# Patient Record
Sex: Female | Born: 1985 | Race: White | Hispanic: No | Marital: Single | Smoking: Never smoker
Health system: Southern US, Community
[De-identification: ages and names within clinical notes are randomized; demographics above are authoritative.]

---

## 2008-12-23 ENCOUNTER — Ambulatory Visit: Payer: Self-pay | Admitting: Family Medicine

## 2009-07-16 ENCOUNTER — Inpatient Hospital Stay: Payer: Self-pay

## 2013-09-27 ENCOUNTER — Emergency Department: Payer: Self-pay | Admitting: Emergency Medicine

## 2013-12-11 ENCOUNTER — Emergency Department: Payer: Self-pay | Admitting: Emergency Medicine

## 2013-12-11 LAB — CBC
HCT: 38.9 % (ref 35.0–47.0)
HGB: 12.5 g/dL (ref 12.0–16.0)
MCH: 29 pg (ref 26.0–34.0)
MCHC: 32.2 g/dL (ref 32.0–36.0)
MCV: 90 fL (ref 80–100)
Platelet: 230 10*3/uL (ref 150–440)
RBC: 4.31 10*6/uL (ref 3.80–5.20)
RDW: 13.1 % (ref 11.5–14.5)
WBC: 11.1 10*3/uL — ABNORMAL HIGH (ref 3.6–11.0)

## 2013-12-11 LAB — HCG, QUANTITATIVE, PREGNANCY: Beta Hcg, Quant.: 1 m[IU]/mL

## 2016-02-08 ENCOUNTER — Emergency Department: Payer: Medicaid Other

## 2016-02-08 ENCOUNTER — Ambulatory Visit
Admission: EM | Admit: 2016-02-08 | Discharge: 2016-02-08 | Disposition: A | Discharge status: Home / Self Care | Payer: Medicaid Other | Attending: Family Medicine | Admitting: Family Medicine

## 2016-02-08 ENCOUNTER — Encounter: Payer: Self-pay | Admitting: Emergency Medicine

## 2016-02-08 ENCOUNTER — Emergency Department
Admission: EM | Admit: 2016-02-08 | Discharge: 2016-02-08 | Disposition: A | Discharge status: Home / Self Care | Payer: Medicaid Other | Attending: Emergency Medicine | Admitting: Emergency Medicine

## 2016-02-08 DIAGNOSIS — Z3A01 Less than 8 weeks gestation of pregnancy: Secondary | ICD-10-CM | POA: Diagnosis not present

## 2016-02-08 DIAGNOSIS — O26891 Other specified pregnancy related conditions, first trimester: Secondary | ICD-10-CM

## 2016-02-08 DIAGNOSIS — Z3490 Encounter for supervision of normal pregnancy, unspecified, unspecified trimester: Secondary | ICD-10-CM

## 2016-02-08 DIAGNOSIS — R109 Unspecified abdominal pain: Secondary | ICD-10-CM | POA: Diagnosis present

## 2016-02-08 DIAGNOSIS — O99611 Diseases of the digestive system complicating pregnancy, first trimester: Secondary | ICD-10-CM | POA: Insufficient documentation

## 2016-02-08 DIAGNOSIS — R1030 Lower abdominal pain, unspecified: Secondary | ICD-10-CM

## 2016-02-08 DIAGNOSIS — K5900 Constipation, unspecified: Secondary | ICD-10-CM | POA: Diagnosis not present

## 2016-02-08 DIAGNOSIS — N390 Urinary tract infection, site not specified: Secondary | ICD-10-CM

## 2016-02-08 DIAGNOSIS — O2341 Unspecified infection of urinary tract in pregnancy, first trimester: Secondary | ICD-10-CM | POA: Insufficient documentation

## 2016-02-08 DIAGNOSIS — O26899 Other specified pregnancy related conditions, unspecified trimester: Secondary | ICD-10-CM | POA: Insufficient documentation

## 2016-02-08 LAB — URINALYSIS, COMPLETE (UACMP) WITH MICROSCOPIC
Bilirubin Urine: NEGATIVE
Glucose, UA: NEGATIVE mg/dL
HGB URINE DIPSTICK: NEGATIVE
Ketones, ur: NEGATIVE mg/dL
NITRITE: NEGATIVE
PROTEIN: NEGATIVE mg/dL
RBC / HPF: NONE SEEN RBC/hpf (ref 0–5)
SPECIFIC GRAVITY, URINE: 1.02 (ref 1.005–1.030)
pH: 6.5 (ref 5.0–8.0)

## 2016-02-08 LAB — COMPREHENSIVE METABOLIC PANEL
ALK PHOS: 71 U/L (ref 38–126)
ALT: 34 U/L (ref 14–54)
AST: 28 U/L (ref 15–41)
Albumin: 4.3 g/dL (ref 3.5–5.0)
Anion gap: 7 (ref 5–15)
BILIRUBIN TOTAL: 0.3 mg/dL (ref 0.3–1.2)
BUN: 12 mg/dL (ref 6–20)
CALCIUM: 8.9 mg/dL (ref 8.9–10.3)
CO2: 26 mmol/L (ref 22–32)
CREATININE: 0.63 mg/dL (ref 0.44–1.00)
Chloride: 100 mmol/L — ABNORMAL LOW (ref 101–111)
GFR calc Af Amer: >60 mL/min (ref >60)
GLUCOSE: 84 mg/dL (ref 65–99)
Potassium: 3.6 mmol/L (ref 3.5–5.1)
Sodium: 133 mmol/L — ABNORMAL LOW (ref 135–145)
TOTAL PROTEIN: 8 g/dL (ref 6.5–8.1)

## 2016-02-08 LAB — CBC WITH DIFFERENTIAL/PLATELET
BASOS PCT: 1 %
Basophils Absolute: 0.1 10*3/uL (ref 0–0.1)
Eosinophils Absolute: 0.5 10*3/uL (ref 0–0.7)
Eosinophils Relative: 5 %
HEMATOCRIT: 40.6 % (ref 35.0–47.0)
Hemoglobin: 13.5 g/dL (ref 12.0–16.0)
Lymphocytes Relative: 28 %
Lymphs Abs: 3.1 10*3/uL (ref 1.0–3.6)
MCH: 29.3 pg (ref 26.0–34.0)
MCHC: 33.3 g/dL (ref 32.0–36.0)
MCV: 87.9 fL (ref 80.0–100.0)
MONO ABS: 1 10*3/uL — AB (ref 0.2–0.9)
Monocytes Relative: 9 %
NEUTROS ABS: 6.5 10*3/uL (ref 1.4–6.5)
Neutrophils Relative %: 57 %
PLATELETS: 220 10*3/uL (ref 150–440)
RBC: 4.61 MIL/uL (ref 3.80–5.20)
RDW: 13.5 % (ref 11.5–14.5)
WBC: 11.2 10*3/uL — AB (ref 3.6–11.0)

## 2016-02-08 LAB — LIPASE, BLOOD: LIPASE: 46 U/L (ref 11–51)

## 2016-02-08 LAB — HCG, QUANTITATIVE, PREGNANCY: hCG, Beta Chain, Quant, S: 26413 m[IU]/mL — ABNORMAL HIGH (ref <5)

## 2016-02-08 MED ORDER — ONDANSETRON 4 MG PO TBDP: 4.0000 mg | ORAL_TABLET | Freq: Three times a day (TID) | ORAL | 0 refills | Status: DC | PRN

## 2016-02-08 MED ORDER — NITROFURANTOIN MONOHYD MACRO 100 MG PO CAPS: 100.0000 mg | ORAL_CAPSULE | Freq: Two times a day (BID) | ORAL | 0 refills | Status: DC

## 2016-02-08 NOTE — Discharge Instructions (Signed)
Recommend patient go to ED for further evaluation  °

## 2016-02-08 NOTE — ED Notes (Signed)
Pt ambulatory to triage without difficulty or distress noted; st sent [facility] to r/o ectopic pregnancy; pt initially seen for abd bloating, unaware of pregnancy; st was told "need to be seen ASAP because it could explode any second"; charge nurse notified and u/s ordered

## 2016-02-08 NOTE — ED Triage Notes (Signed)
Pt ambulatory to triaged with steady gait sent [facility], pt reports was told she might have an ectopic pregnancy. Pt c/o abd pain for a week.

## 2016-02-08 NOTE — ED Provider Notes (Signed)
[facility] Emergency Department Provider Note        Time seen: ----------------------------------------- 10:11 PM on 02/08/2016 -----------------------------------------    I have reviewed the triage vital signs and the nursing notes.   HISTORY  Chief Complaint Abdominal Pain and Threatened Miscarriage    HPI Martha Scott is a 30 y.o. female presents to the ER for possible ectopic pregnancy. Patient's had bilateral abdominal pain for the past week and is concerned she may be pregnant as well. Patient was sent from urgent care to be evaluated for ectopic pregnancy. She's had abdominal pain for the last 7 days. His fevers, chills, states she has been somewhat constipated, has also had some nausea.   History reviewed. No pertinent past medical history.  There are no active problems to display for this patient.   History reviewed. No pertinent surgical history.  Allergies Patient has no known allergies.  Social History Social History  Substance Use Topics  . Smoking status: Never Smoker  . Smokeless tobacco: Never Used  . Alcohol use Yes    Review of Systems Constitutional: Negative for fever. Cardiovascular: Negative for chest pain. Respiratory: Negative for shortness of breath. Gastrointestinal: Positive for abdominal pain, constipation, nausea Genitourinary: Negative for dysuria. Musculoskeletal: Negative for back pain. Skin: Negative for rash. Neurological: Negative for headaches, focal weakness or numbness.  10-point ROS otherwise negative.  ____________________________________________   PHYSICAL EXAM:  VITAL SIGNS: ED Triage Vitals  Enc Vitals Group     BP 02/08/16 2032 111/70     Pulse Rate 02/08/16 2032 72     Resp 02/08/16 2032 18     Temp 02/08/16 2032 98.7 F (37.1 C)     Temp Source 02/08/16 2032 Oral     SpO2 02/08/16 2032 100 %     Weight 02/08/16 2036 162 lb (73.5 kg)     Height 02/08/16 2036 5\' 5"  (1.651  m)     Head Circumference --      Peak Flow --      Pain Score 02/08/16 2036 6     Pain Loc --      Pain Edu? --      Excl. in GC? --     Constitutional: Alert and oriented. Well appearing and in no distress. Eyes: Conjunctivae are normal. PERRL. Normal extraocular movements. ENT   Head: Normocephalic and atraumatic.   Nose: No congestion/rhinnorhea.   Mouth/Throat: Mucous membranes are moist.   Neck: No stridor. Cardiovascular: Normal rate, regular rhythm. No murmurs, rubs, or gallops. Respiratory: Normal respiratory effort without tachypnea nor retractions. Breath sounds are clear and equal bilaterally. No wheezes/rales/rhonchi. Gastrointestinal: Nonfocal tenderness, no rebound or guarding. Normal bowel sounds. Musculoskeletal: Nontender with normal range of motion in all extremities. No lower extremity tenderness nor edema. Neurologic:  Normal speech and language. No gross focal neurologic deficits are appreciated.  Skin:  Skin is warm, dry and intact. No rash noted. Psychiatric: Mood and affect are normal. Speech and behavior are normal.  ____________________________________________  ED COURSE:  Pertinent labs & imaging results that were available during my care of the patient were reviewed by me and considered in my medical decision making (see chart for details). Clinical Course   Patient is in no acute distress, we will assess with labs and ultrasound.  Procedures ____________________________________________   LABS (pertinent positives/negatives)  Labs Reviewed - No data to display  RADIOLOGY Images were viewed by me  IMPRESSION: Single intrauterine gestation with measurements and dates as above.  ____________________________________________  FINAL ASSESSMENT AND PLAN  Abdominal pain and pregnancy, UTI  Plan: Patient with labs and imaging as dictated above. Patient with outpatient labs today which reveal UTI. Ultrasound is reassuring. She'll  be discharged with Zofran, Macrobid and outpatient follow-up.   Emily FilbertWilliams, Zarah Carbon E, MD   Note: This dictation was prepared with Dragon dictation. Any transcriptional errors that result from this process are unintentional    Emily FilbertJonathan E Annaclaire Walsworth, MD 02/08/16 2213

## 2016-02-08 NOTE — ED Triage Notes (Signed)
Patient c/o having bloated abdomen. She feels like she isnt digesting her food. Her last bm was yesterday. She feels nausea, she hasnt had her menstrual cycle for 2 months. She had a pap smear about 2 months ago,  her test results were benign for cervical cancer . She doesn't know if she is pregnant or not. She was treated for BV recently. She is still having some discharge.

## 2016-02-10 LAB — URINE CULTURE: SPECIAL REQUESTS: NORMAL

## 2016-04-27 NOTE — ED Provider Notes (Signed)
[facility]    CSN: [id number] Arrival date & time: 02/08/16  1459     History   Chief Complaint Chief Complaint  Patient presents with  . Abdominal Pain    HPI Georgiann Cockerlizabeth Reger is a 31 y.o. female.   The history is provided by the patient.  Abdominal Pain  Pain location:  LLQ and RLQ Pain quality: bloating   Pain radiates to:  Does not radiate Pain severity:  Moderate Onset quality:  Sudden Timing:  Constant Progression:  Worsening Chronicity:  New Context: retching   Context: not alcohol use, not awakening from sleep, not diet changes, not eating, not laxative use, not medication withdrawal, not previous surgeries, not recent illness, not recent sexual activity, not recent travel, not sick contacts, not suspicious food intake and not trauma   Relieved by:  None tried Ineffective treatments:  None tried Associated symptoms: nausea   Associated symptoms: no chest pain, no chills, no constipation, no diarrhea, no dysuria, no fever, no hematemesis, no hematochezia, no hematuria and no vaginal bleeding   Risk factors: no alcohol abuse, no aspirin use, not elderly, has not had multiple surgeries, no NSAID use, not obese and no recent hospitalization  Pregnant now: unknown per patient; however has not had menstrual period in 2 months.     No past medical history on file.  There are no active problems to display for this patient.   No past surgical history on file.  OB History    No data available       Home Medications    Prior to Admission medications   Medication Sig Start Date End Date Taking? Authorizing Provider  ALPRAZolam Prudy Feeler(XANAX) 0.5 MG tablet Take 0.5 mg by mouth at bedtime as needed for anxiety.   Yes Historical Provider, MD  nitrofurantoin, macrocrystal-monohydrate, (MACROBID) 100 MG capsule Take 1 capsule (100 mg total) by mouth 2 (two) times daily. 02/08/16   Emily FilbertJonathan E Williams, MD  ondansetron (ZOFRAN ODT) 4 MG disintegrating tablet Take  1 tablet (4 mg total) by mouth every 8 (eight) hours as needed for nausea or vomiting. 02/08/16   Emily FilbertJonathan E Williams, MD    Family History No family history on file.  Social History Social History  Substance Use Topics  . Smoking status: Never Smoker  . Smokeless tobacco: Never Used  . Alcohol use Yes     Allergies   Patient has no known allergies.   Review of Systems Review of Systems  Constitutional: Negative for chills and fever.  Cardiovascular: Negative for chest pain.  Gastrointestinal: Positive for abdominal pain and nausea. Negative for constipation, diarrhea, hematemesis and hematochezia.  Genitourinary: Negative for dysuria, hematuria and vaginal bleeding.     Physical Exam Triage Vital Signs ED Triage Vitals  Enc Vitals Group     BP 02/08/16 1731 117/74     Pulse Rate 02/08/16 1731 76     Resp 02/08/16 1731 18     Temp 02/08/16 1731 98.2 F (36.8 C)     Temp Source 02/08/16 1731 Oral     SpO2 02/08/16 1731 100 %     Weight 02/08/16 1729 162 lb (73.5 kg)     Height 02/08/16 1729 5\' 5"  (1.651 m)     Head Circumference --      Peak Flow --      Pain Score 02/08/16 1731 4     Pain Loc --      Pain Edu? --      Excl.  in GC? --    No data found.   Updated Vital Signs BP 117/74 (BP Location: Left Arm)   Pulse 76   Temp 98.2 F (36.8 C) (Oral)   Resp 18   Ht 5\' 5"  (1.651 m)   Wt 162 lb (73.5 kg)   LMP 11/29/2015   SpO2 100%   BMI 26.96 kg/m   Visual Acuity Right Eye Distance:   Left Eye Distance:   Bilateral Distance:    Right Eye Near:   Left Eye Near:    Bilateral Near:     Physical Exam  Constitutional: She appears well-developed and well-nourished. No distress.  Abdominal: Soft. Bowel sounds are normal. She exhibits no distension and no mass. There is tenderness (diffuse, lower abdominal; no rebound or guarding). There is no rebound and no guarding.  Skin: She is not diaphoretic.  Nursing note and vitals reviewed.    UC  Treatments / Results  Labs (all labs ordered are listed, but only abnormal results are displayed) Labs Reviewed  URINE CULTURE - Abnormal; Notable for the following:       Result Value   Culture MULTIPLE SPECIES PRESENT, SUGGEST RECOLLECTION (*)    All other components within normal limits  COMPREHENSIVE METABOLIC PANEL - Abnormal; Notable for the following:    Sodium 133 (*)    Chloride 100 (*)    All other components within normal limits  HCG, QUANTITATIVE, PREGNANCY - Abnormal; Notable for the following:    hCG, Beta Chain, Quant, S 26,413 (*)    All other components within normal limits  CBC WITH DIFFERENTIAL/PLATELET - Abnormal; Notable for the following:    WBC 11.2 (*)    Monocytes Absolute 1.0 (*)    All other components within normal limits  URINALYSIS, COMPLETE (UACMP) WITH MICROSCOPIC - Abnormal; Notable for the following:    APPearance CLOUDY (*)    Leukocytes, UA LARGE (*)    Squamous Epithelial / LPF 6-30 (*)    Bacteria, UA MANY (*)    All other components within normal limits  LIPASE, BLOOD    EKG  EKG Interpretation None       Radiology No results found.  Procedures Procedures (including critical care time)  Medications Ordered in UC Medications - No data to display   Initial Impression / Assessment and Plan / UC Course  I have reviewed the triage vital signs and the nursing notes.  Pertinent labs & imaging results that were available during my care of the patient were reviewed by me and considered in my medical decision making (see chart for details).       Final Clinical Impressions(s) / UC Diagnoses   Final diagnoses:  Lower abdominal pain  Pregnancy, unspecified gestational age    New Prescriptions Discharge Medication List as of 02/08/2016  7:45 PM     1. Lab results and diagnosis reviewed with patient; due to patient's symptoms and unknown pregnancy gestational age, recommend patient go to ED for further evaluation (possible US  imaging) and management; patient verbalizes understanding and will proceed to ED in stable condition by private vehicle   Payton Mccallum, MD 04/27/16 [phone]

## 2017-01-18 ENCOUNTER — Emergency Department
Admission: EM | Admit: 2017-01-18 | Discharge: 2017-01-19 | Disposition: A | Discharge status: Home / Self Care | Payer: Medicaid Other | Attending: Emergency Medicine | Admitting: Emergency Medicine

## 2017-01-18 ENCOUNTER — Encounter: Payer: Self-pay | Admitting: Emergency Medicine

## 2017-01-18 DIAGNOSIS — Z79899 Other long term (current) drug therapy: Secondary | ICD-10-CM | POA: Insufficient documentation

## 2017-01-18 DIAGNOSIS — J020 Streptococcal pharyngitis: Secondary | ICD-10-CM | POA: Insufficient documentation

## 2017-01-18 DIAGNOSIS — R070 Pain in throat: Secondary | ICD-10-CM | POA: Diagnosis present

## 2017-01-18 LAB — POCT RAPID STREP A: STREPTOCOCCUS, GROUP A SCREEN (DIRECT): POSITIVE — AB

## 2017-01-18 MED ORDER — ACETAMINOPHEN 500 MG PO TABS
1000.0000 mg | ORAL_TABLET | Freq: Once | ORAL | Status: AC
  Administered 2017-01-18: 1000 mg via ORAL
  Filled 2017-01-18: qty 2

## 2017-01-18 NOTE — ED Triage Notes (Addendum)
Patient ambulatory to triage with steady gait, without difficulty or distress noted, mask in place; pt reports fever & sore throat since yesterday; pt reports that she took 1000mg  ibuprofen PTA; pt education on proper dosing of ibuprofen; pt reports her son recently dx with strep

## 2017-01-18 NOTE — ED Notes (Signed)
POCT Rapid Strep A POSITIVE

## 2017-01-19 LAB — CBC WITH DIFFERENTIAL/PLATELET
Basophils Absolute: 0.1 10*3/uL (ref 0–0.1)
Basophils Relative: 0 %
EOS ABS: 0 10*3/uL (ref 0–0.7)
Eosinophils Relative: 0 %
HEMATOCRIT: 40.8 % (ref 35.0–47.0)
HEMOGLOBIN: 13.5 g/dL (ref 12.0–16.0)
LYMPHS ABS: 1.2 10*3/uL (ref 1.0–3.6)
LYMPHS PCT: 8 %
MCH: 28.6 pg (ref 26.0–34.0)
MCHC: 33 g/dL (ref 32.0–36.0)
MCV: 86.7 fL (ref 80.0–100.0)
MONOS PCT: 5 %
Monocytes Absolute: 0.9 10*3/uL (ref 0.2–0.9)
NEUTROS PCT: 87 %
Neutro Abs: 13.9 10*3/uL — ABNORMAL HIGH (ref 1.4–6.5)
PLATELETS: 244 10*3/uL (ref 150–440)
RBC: 4.7 MIL/uL (ref 3.80–5.20)
RDW: 14 % (ref 11.5–14.5)
WBC: 16.1 10*3/uL — ABNORMAL HIGH (ref 3.6–11.0)

## 2017-01-19 LAB — BASIC METABOLIC PANEL
Anion gap: 9 (ref 5–15)
BUN: 12 mg/dL (ref 6–20)
CHLORIDE: 103 mmol/L (ref 101–111)
CO2: 24 mmol/L (ref 22–32)
CREATININE: 0.83 mg/dL (ref 0.44–1.00)
Calcium: 8.6 mg/dL — ABNORMAL LOW (ref 8.9–10.3)
GFR calc non Af Amer: >60 mL/min (ref >60)
Glucose, Bld: 108 mg/dL — ABNORMAL HIGH (ref 65–99)
POTASSIUM: 3.2 mmol/L — AB (ref 3.5–5.1)
Sodium: 136 mmol/L (ref 135–145)

## 2017-01-19 MED ORDER — AMOXICILLIN-POT CLAVULANATE 875-125 MG PO TABS
1.0000 | ORAL_TABLET | Freq: Once | ORAL | Status: AC
  Administered 2017-01-19: 1 via ORAL
  Filled 2017-01-19: qty 1

## 2017-01-19 MED ORDER — AMOXICILLIN-POT CLAVULANATE 875-125 MG PO TABS: 1.0000 | ORAL_TABLET | Freq: Two times a day (BID) | ORAL | 0 refills | Status: AC

## 2017-01-19 MED ORDER — SODIUM CHLORIDE 0.9 % IV BOLUS (SEPSIS)
1000.0000 mL | INTRAVENOUS | Status: AC
  Administered 2017-01-19: 1000 mL via INTRAVENOUS

## 2017-01-19 NOTE — Discharge Instructions (Signed)
Please take the full course of antibiotics as prescribed (twice a day until the pills are gone).  Do not stop taking them early!  You may develop diarrhea on this medication, so be sure and stay hydrated by drinking plenty of fluids (water, Gatorade, etc).  Use over-the-counter ibuprofen and/or Tylenol as needed for pain/fever.  Return to the emergency department if you develop new or worsening symptoms that concern you, such as worsening fever, changing (muffled) voice, difficulty swallowing, etc.

## 2017-01-19 NOTE — ED Provider Notes (Signed)
[facility] Emergency Department Provider Note  ____________________________________________   First MD Initiated Contact with Patient 01/19/17 0012     (approximate)  I have reviewed the triage vital signs and the nursing notes.   HISTORY  Chief Complaint Sore Throat and Fever    HPI Martha Scott is a 31 y.o. female who is otherwise healthy and presents for evaluation of a relatively acute onset sore throat over the last couple of days.  She had some mild pain yesterday but I was not much of a problem.  Over the course of the last day it has become severe.  She can still swallow but it hurts to do so.  She has not had any voice change such as a muffled or hoarse voice.  Hot foods or liquids make the throat hurt worse, nothing in particular makes it better.  She has had mild fever.  She denies cough, chest pain, shortness of breath, ear pain, nausea, vomiting, and abdominal pain.  She reports that her child had strep throat but that was more than a month ago.  She has not had any antibiotics recently.  History reviewed. No pertinent past medical history.  There are no active problems to display for this patient.   History reviewed. No pertinent surgical history.  Prior to Admission medications   Medication Sig Start Date End Date Taking? Authorizing Provider  ALPRAZolam Prudy Feeler(XANAX) 0.5 MG tablet Take 0.5 mg by mouth at bedtime as needed for anxiety.    [provider]  amoxicillin-clavulanate (AUGMENTIN) 875-125 MG tablet Take 1 tablet by mouth every 12 (twelve) hours for 14 days. 01/19/17 02/02/17  Loleta RoseForbach, Nekia Maxham, MD  nitrofurantoin, macrocrystal-monohydrate, (MACROBID) 100 MG capsule Take 1 capsule (100 mg total) by mouth 2 (two) times daily. 02/08/16   Emily FilbertWilliams, Jonathan E, MD  ondansetron (ZOFRAN ODT) 4 MG disintegrating tablet Take 1 tablet (4 mg total) by mouth every 8 (eight) hours as needed for nausea or vomiting. 02/08/16   Emily FilbertWilliams, Jonathan  E, MD    Allergies Patient has no known allergies.  No family history on file.  Social History Social History   Tobacco Use  . Smoking status: Never Smoker  . Smokeless tobacco: Never Used  Substance Use Topics  . Alcohol use: Yes  . Drug use: No    Review of Systems Constitutional: Mild fever Eyes: No visual changes. ENT: Sore throat and odynophagia Cardiovascular: Denies chest pain. Respiratory: Denies shortness of breath. Gastrointestinal: No abdominal pain.  No nausea, no vomiting.  No diarrhea.  No constipation. Genitourinary: Negative for dysuria. Musculoskeletal: Negative for neck pain.  Negative for back pain. Integumentary: Negative for rash. Neurological: Negative for headaches, focal weakness or numbness.   ____________________________________________   PHYSICAL EXAM:  VITAL SIGNS: ED Triage Vitals  Enc Vitals Group     BP 01/18/17 2330 122/90     Pulse Rate 01/18/17 2330 (!) 129     Resp 01/18/17 2330 20     Temp 01/18/17 2330 (!) 102 F (38.9 C)     Temp Source 01/18/17 2330 Oral     SpO2 01/18/17 2330 98 %     Weight 01/18/17 2330 77.1 kg (170 lb)     Height 01/18/17 2330 1.651 m (5\' 5" )     Head Circumference --      Peak Flow --      Pain Score 01/18/17 2328 8     Pain Loc --      Pain Edu? --  Excl. in GC? --     Constitutional: Alert and oriented.  Appears uncomfortable but nontoxic Eyes: Conjunctivae are normal.  Head: Atraumatic. Ears:  Healthy appearing ear canals and TMs bilaterally Nose: No congestion/rhinnorhea. Mouth/Throat: Mucous membranes are moist.  Oropharynx non-erythematous.  The right tonsil appears enlarged, inflamed, and has some exudate.  There is no uvular deviation and no evidence of peritonsillar abscess in spite of the enlarged tonsil.  Left tonsil looks erythematous but otherwise normal.  Soft under tongue, no evidence of Ludwig's angina.  Normal voice. Neck: No stridor.  No meningeal signs.  Patient has no  tenderness with manipulation of the larynx, no mandibular brawny induration. Cardiovascular: Tachycardia, regular rhythm. Good peripheral circulation. Grossly normal heart sounds. Respiratory: Normal respiratory effort.  No retractions. Lungs CTAB. Gastrointestinal: Soft and nontender. No distention.  Musculoskeletal: No lower extremity tenderness nor edema. No gross deformities of extremities. Neurologic:  Normal speech and language. No gross focal neurologic deficits are appreciated.  Skin:  Skin is warm, dry and intact. No rash noted. Psychiatric: Mood and affect are normal. Speech and behavior are normal.  ____________________________________________   LABS (all labs ordered are listed, but only abnormal results are displayed)  Labs Reviewed  BASIC METABOLIC PANEL - Abnormal; Notable for the following components:      Result Value   Potassium 3.2 (*)    Glucose, Bld 108 (*)    Calcium 8.6 (*)    All other components within normal limits  CBC WITH DIFFERENTIAL/PLATELET - Abnormal; Notable for the following components:   WBC 16.1 (*)    Neutro Abs 13.9 (*)    All other components within normal limits  POCT RAPID STREP A - Abnormal; Notable for the following components:   Streptococcus, Group A Screen (Direct) POSITIVE (*)    All other components within normal limits   ____________________________________________  EKG  None - EKG not ordered by ED physician ____________________________________________  RADIOLOGY   No results found.  ____________________________________________   PROCEDURES  Critical Care performed: No   Procedure(s) performed:   Procedures   ____________________________________________   INITIAL IMPRESSION / ASSESSMENT AND PLAN / ED COURSE  As part of my medical decision making, I reviewed the following data within the electronic MEDICAL RECORD NUMBER Nursing notes reviewed and incorporated and Labs reviewed     Differential diagnosis  includes, but is not limited to, viral pharyngitis, strep pharyngitis or other bacterial pharyngitis, tonsillitis, retropharyngeal abscess, peritonsillar abscess, Ludwig's angina, epiglottitis.  The patient's strep screening was positive.  On physical exam she has no signs of peritonsillar abscess at this point even though her right tonsil appears infected.  She has no signs concerning for epiglottitis and is in a reclined position, tolerating her secretions, able to tolerate p.o. intake, no evidence of airway compromise, no muffled voice.  I believe she would benefit from antibiotic treatment and I will treat her aggressively with Augmentin on the rare chance that she is developing a small abscess or phlegmon, but I do not feel she would benefit from CT scan of her neck at this point and I think she should be fine with empiric treatment.  Given her tachycardia and general uncomfortable appearance, I will give her a liter of fluids and check some basic labs, but she is comfortable with the plan for discharge and outpatient follow-up.  I gave strict return precautions.  Clinical Course as of Jan 20 139  Thu Jan 19, 2017  0129 Potassium slightly low, WBC slightly elevated  at 5516, otherwise labs are unremarkable.  Will continue with plan for discharge with Augmentin and strict return precautions.  [CF]    Clinical Course User Index [CF] Loleta RoseForbach, Antoinett Dorman, MD    ____________________________________________  FINAL CLINICAL IMPRESSION(S) / ED DIAGNOSES  Final diagnoses:  Strep pharyngitis     MEDICATIONS GIVEN DURING THIS VISIT:  Medications  acetaminophen (TYLENOL) tablet 1,000 mg (1,000 mg Oral Given 01/18/17 2335)  amoxicillin-clavulanate (AUGMENTIN) 875-125 MG per tablet 1 tablet (1 tablet Oral Given 01/19/17 0025)  sodium chloride 0.9 % bolus 1,000 mL (0 mLs Intravenous Stopped 01/19/17 0122)     ED Discharge Orders        Ordered    amoxicillin-clavulanate (AUGMENTIN) 875-125 MG tablet   Every 12 hours     01/19/17 0130       Note:  This document was prepared using Dragon voice recognition software and may include unintentional dictation errors.    Loleta RoseForbach, Sharika Mosquera, MD 01/19/17 [id number]

## 2017-01-19 NOTE — ED Notes (Signed)
ED Provider at bedside. 

## 2017-01-19 NOTE — ED Notes (Signed)

## 2017-06-26 IMAGING — US US OB COMP LESS 14 WK
1 series · 14 of 28 positions shown · non-contrast
Comparison: None.

CLINICAL DATA: Abdominal pain

EXAM:
OBSTETRIC <14 WK US AND TRANSVAGINAL OB US
TECHNIQUE: Both transabdominal and transvaginal ultrasound examinations were
performed for complete evaluation of the gestation as well as the
maternal uterus, adnexal regions, and pelvic cul-de-sac.
Transvaginal technique was performed to assess early pregnancy.

[Series 1: us ob comp less 14 wk · 0.15mm/px · 97 acquisitions, 14 frames shown]
[im 4/97]
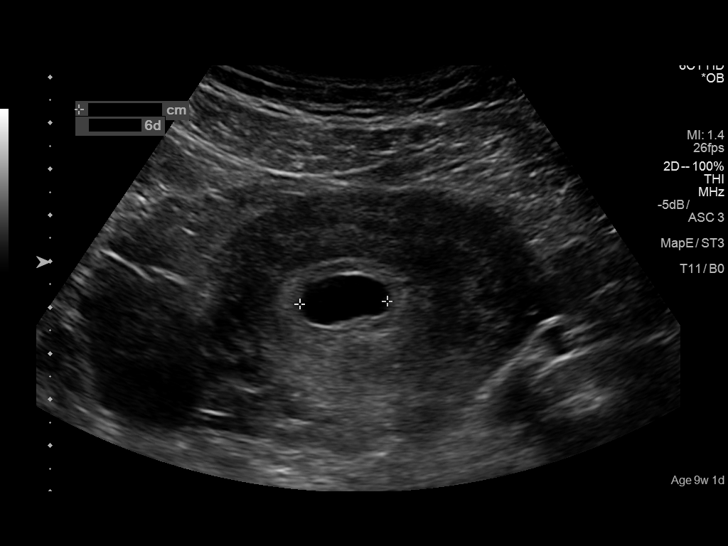
[im 11/97]
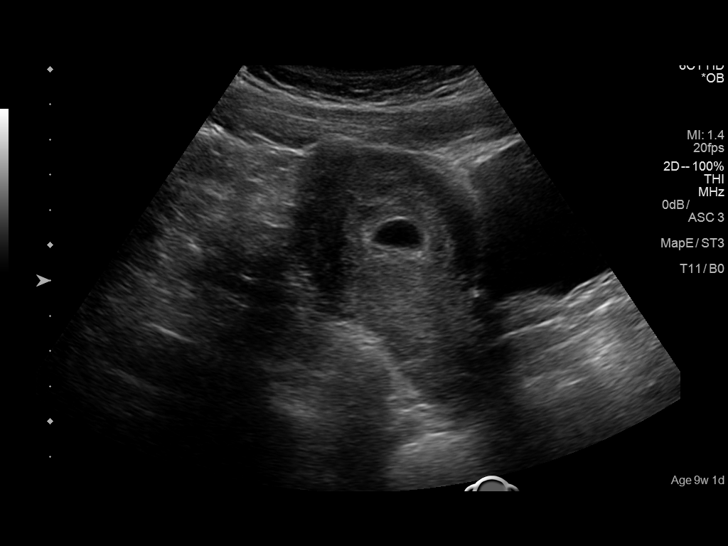
[im 18/97]
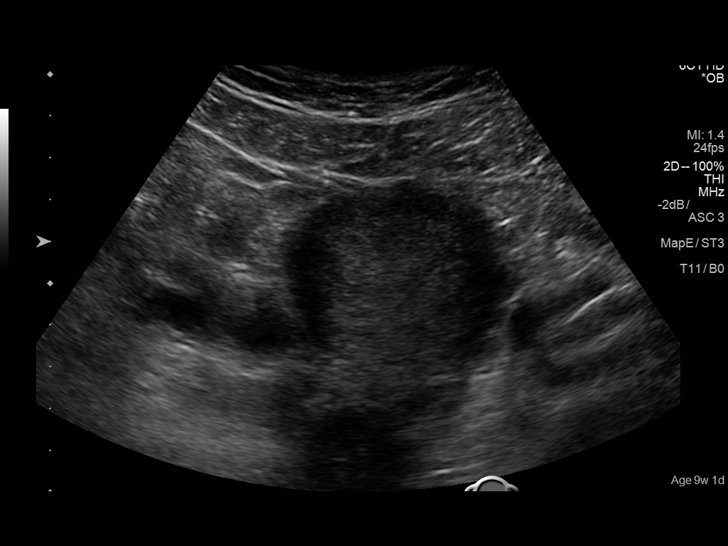
[im 25/97]
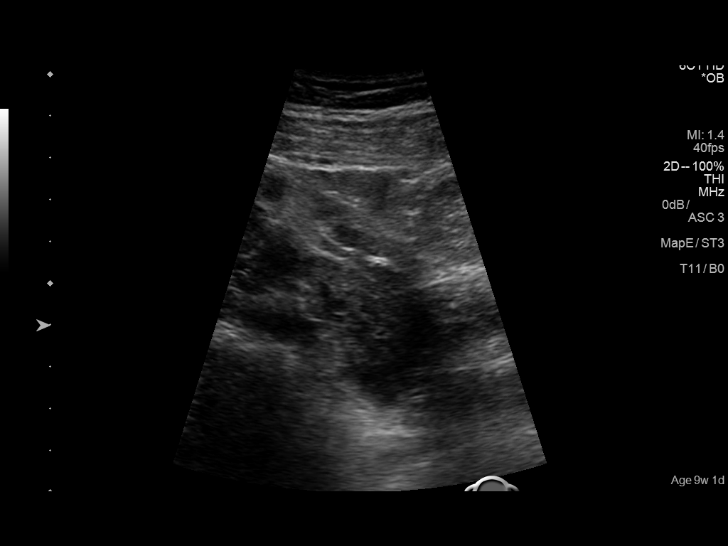
[im 33/97]
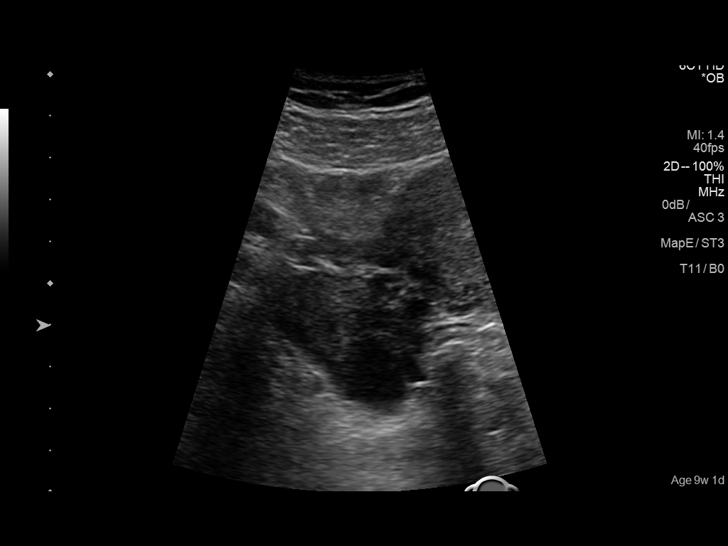
[im 40/97]
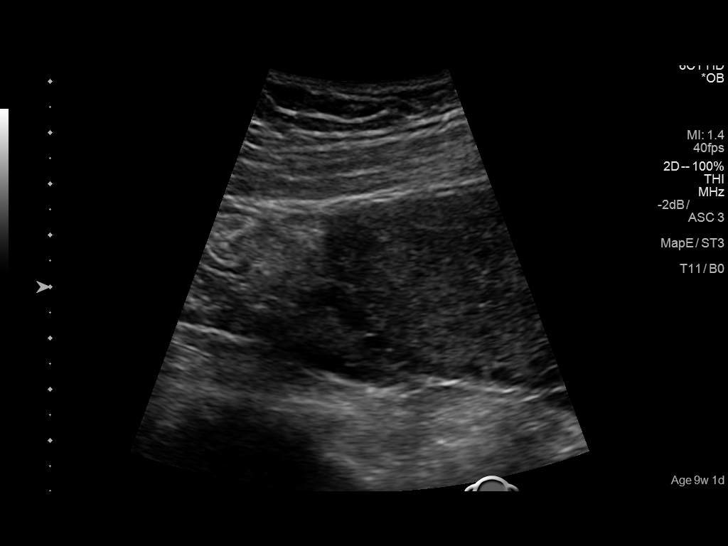
[im 47/97]
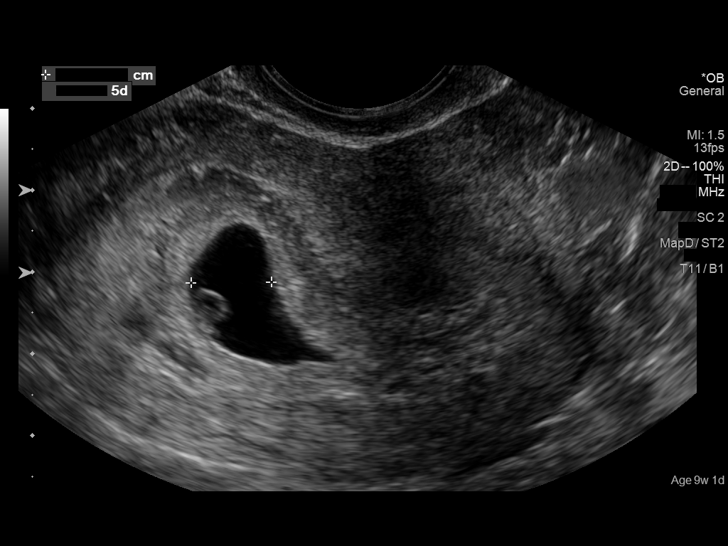
[im 54/97]
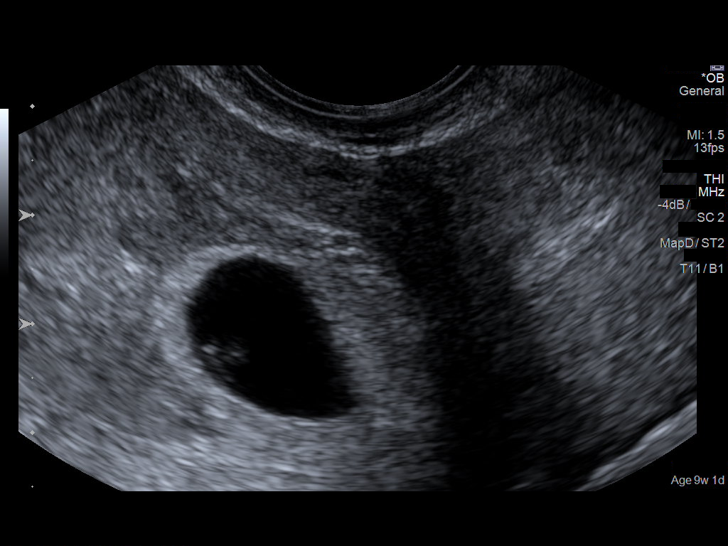
[im 61/97]
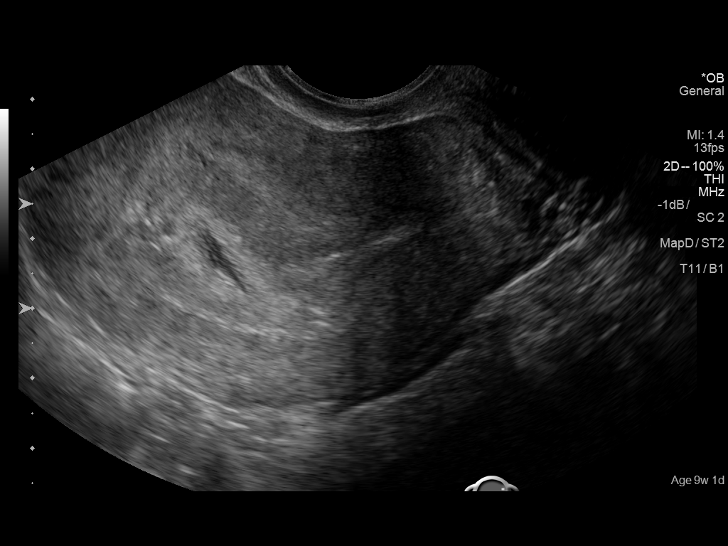
[im 68/97]
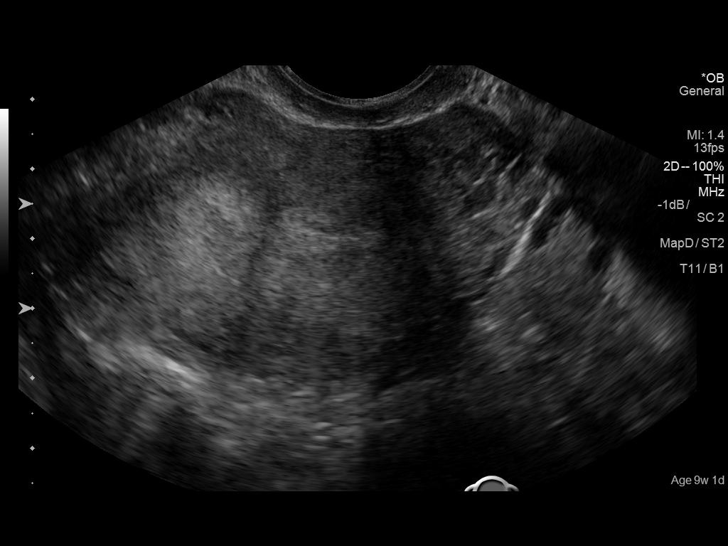
[im 75/97]
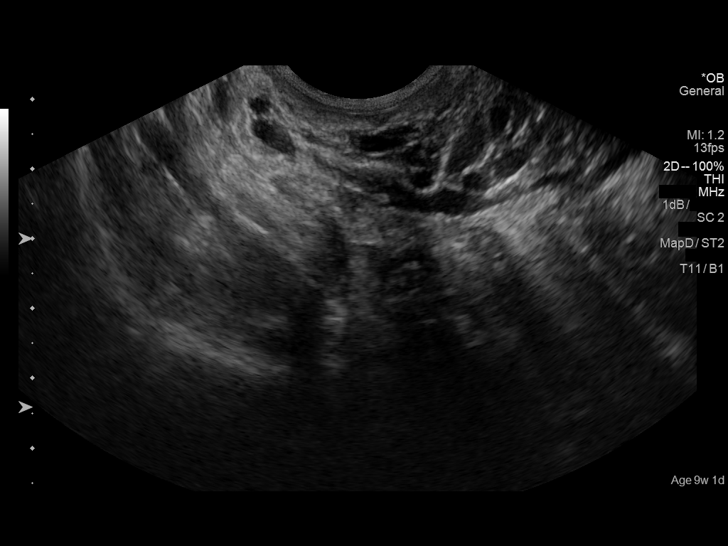
[im 82/97]
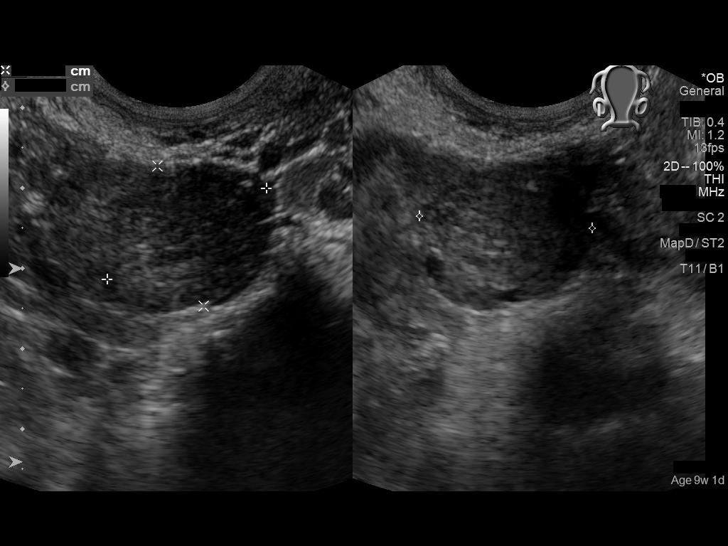
[im 89/97]
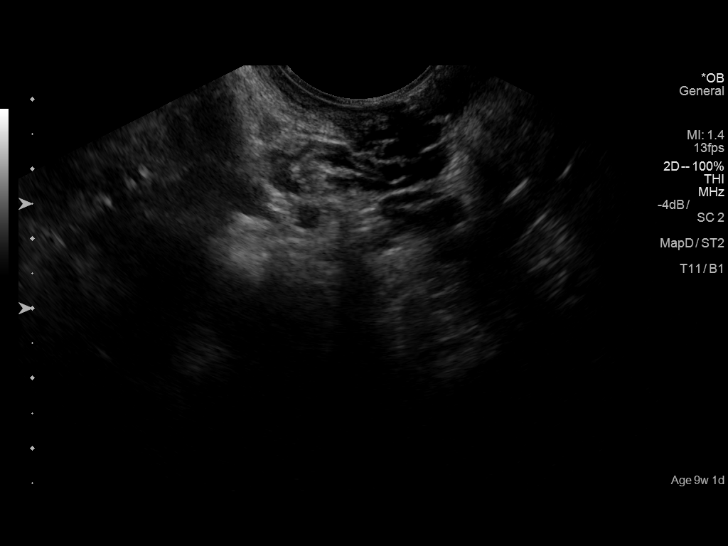
[im 97/97]
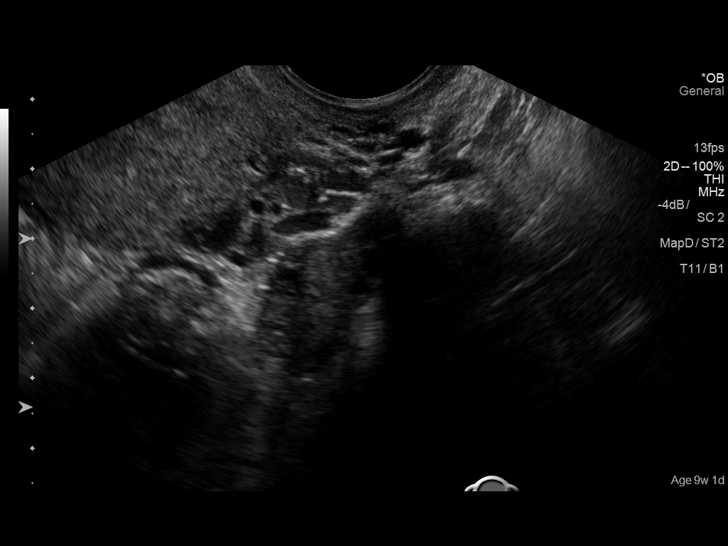

[14 of 28 positions shown; findings below may reference images not displayed]

FINDINGS: Intrauterine gestational sac: Single intrauterine gestational sac

Yolk sac:  Visualized

Embryo:  Visualized

Cardiac Activity: Visualized

Heart Rate: 101  bpm

CRL:  5.4  mm   6 w   2 d                  US EDC: 10/01/2016

Subchorionic hemorrhage:  None visualized.

Maternal uterus/adnexae: Left ovary is within normal limits
measuring 1.6 by 3.4 x 1.9 cm. Right ovary measures 3.6 by 1.8 x
cm. Hypoechoic area in the right ovary possibly related to
hemorrhagic corpus luteal cyst. No free fluid.
IMPRESSION: Single intrauterine gestation with measurements and dates as above.

## 2018-02-06 ENCOUNTER — Encounter: Payer: Self-pay | Admitting: Emergency Medicine

## 2018-02-06 ENCOUNTER — Emergency Department: Payer: Medicaid Other

## 2018-02-06 ENCOUNTER — Emergency Department
Admission: EM | Admit: 2018-02-06 | Discharge: 2018-02-06 | Disposition: A | Discharge status: Home / Self Care | Payer: Medicaid Other | Attending: Emergency Medicine | Admitting: Emergency Medicine

## 2018-02-06 DIAGNOSIS — M545 Low back pain: Secondary | ICD-10-CM | POA: Diagnosis present

## 2018-02-06 DIAGNOSIS — M5432 Sciatica, left side: Secondary | ICD-10-CM | POA: Insufficient documentation

## 2018-02-06 LAB — POCT PREGNANCY, URINE: Preg Test, Ur: NEGATIVE

## 2018-02-06 MED ORDER — DEXAMETHASONE SODIUM PHOSPHATE 10 MG/ML IJ SOLN
10.0000 mg | Freq: Once | INTRAMUSCULAR | Status: AC
  Administered 2018-02-06: 10 mg via INTRAMUSCULAR
  Filled 2018-02-06: qty 1

## 2018-02-06 MED ORDER — HYDROCODONE-ACETAMINOPHEN 5-325 MG PO TABS: 1.0000 | ORAL_TABLET | Freq: Four times a day (QID) | ORAL | 0 refills | Status: DC | PRN

## 2018-02-06 MED ORDER — PREDNISONE 10 MG PO TABS: ORAL_TABLET | ORAL | 0 refills | Status: DC

## 2018-02-06 NOTE — Discharge Instructions (Signed)
Follow-up with [facility] acute care if any continued problems or make an appointment with Dr. Allena KatzPatel who is the orthopedist on-call today.  The prednisone pack you will start and taper down by 1 tablet each day.  This should help with your symptoms.  Do not take anti-inflammatories with this medication but you may take Tylenol if needed for pain.  Norco is also for pain if needed.  Do not drive or operate machinery while taking that medication.  It also contains Tylenol so you do not need to take extra Tylenol with this medication.

## 2018-02-06 NOTE — ED Notes (Signed)
Pt states she has been having pain/flare with the sciatic nerve on the left side. States she went to the chiropractor on Saturday and it has gotten worse instead of better. States she has been taking IBU and tylenol with the use of an ice pack.

## 2018-02-06 NOTE — ED Provider Notes (Signed)
[facility] Emergency Department Provider Note  ____________________________________________   First MD Initiated Contact with Patient 02/06/18 1316     (approximate)  I have reviewed the triage vital signs and the nursing notes.   HISTORY  Chief Complaint Back Pain    HPI Martha Scott is a 32 y.o. female presents to the ED with complaint of sciatic nerve pain since the fourth.  Patient states that she has pain that goes from her lower back into the middle of her "butt cheek".  On the left.  Patient states that she had a injury to her back 5 years ago but it was never x-rayed.  She states that she began having difficulty and saw a chiropractor on Saturday and after that had increased difficulty with movement.  She has been taking ibuprofen with minimal relief.  She denies any urinary symptoms or history of kidney stones.  There is been no saddle anesthesias or incontinence of bowel bladder.  Currently she rates her pain as 10/10.   History reviewed. No pertinent past medical history.  There are no active problems to display for this patient.   History reviewed. No pertinent surgical history.  Prior to Admission medications   Medication Sig Start Date End Date Taking? Authorizing Provider  ALPRAZolam Prudy Feeler) 0.5 MG tablet Take 0.5 mg by mouth at bedtime as needed for anxiety.    [provider]  HYDROcodone-acetaminophen (NORCO/VICODIN) 5-325 MG tablet Take 1 tablet by mouth every 6 (six) hours as needed for moderate pain. 02/06/18   Tommi Rumps, PA-C  nitrofurantoin, macrocrystal-monohydrate, (MACROBID) 100 MG capsule Take 1 capsule (100 mg total) by mouth 2 (two) times daily. 02/08/16   Emily Filbert, MD  ondansetron (ZOFRAN ODT) 4 MG disintegrating tablet Take 1 tablet (4 mg total) by mouth every 8 (eight) hours as needed for nausea or vomiting. 02/08/16   Emily Filbert, MD  predniSONE (DELTASONE) 10 MG tablet Take 6  tablets  today, on day 2 take 5 tablets, day 3 take 4 tablets, day 4 take 3 tablets, day 5 take  2 tablets and 1 tablet the last day 02/06/18   Tommi Rumps, PA-C    Allergies Patient has no known allergies.  No family history on file.  Social History Social History   Tobacco Use  . Smoking status: Never Smoker  . Smokeless tobacco: Never Used  Substance Use Topics  . Alcohol use: Yes  . Drug use: No    Review of Systems Constitutional: No fever/chills Cardiovascular: Denies chest pain. Respiratory: Denies shortness of breath. Gastrointestinal: No abdominal pain.  No nausea, no vomiting.  No diarrhea.  Genitourinary: Negative for dysuria. Musculoskeletal: Positive for low back pain with radiculopathy into the left buttocks. Skin: Negative for rash. Neurological: Negative for headaches, focal weakness or numbness.  ____________________________________________   PHYSICAL EXAM:  VITAL SIGNS: ED Triage Vitals [02/06/18 1257]  Enc Vitals Group     BP 123/78     Pulse Rate 90     Resp 18     Temp 99.2 F (37.3 C)     Temp Source Oral     SpO2 98 %     Weight 165 lb (74.8 kg)     Height 5' 5.25" (1.657 m)     Head Circumference      Peak Flow      Pain Score 10     Pain Loc      Pain Edu?  Excl. in GC?     Constitutional: Alert and oriented. Well appearing and in no acute distress. Eyes: Conjunctivae are normal.  Head: Atraumatic. Neck: No stridor.   Cardiovascular: Normal rate, regular rhythm. Grossly normal heart sounds.  Good peripheral circulation. Respiratory: Normal respiratory effort.  No retractions. Lungs CTAB. Gastrointestinal: Soft and nontender. No distention.  Musculoskeletal: On exam there is no gross deformity noted on the examination of her back.  There is moderate tenderness on palpation of the left SI joint area and surrounding tissue.  No soft tissue edema or skin discoloration suggestive of an injury.  Straight leg raises are  restricted secondary to patient's discomfort.  Good muscle strength bilaterally patient is able to ambulate with out assistance.  She does have a limping type gait. Neurologic:  Normal speech and language. No gross focal neurologic deficits are appreciated.  Reflexes are 2+ bilaterally.  No gait instability. Skin:  Skin is warm, dry and intact.  Psychiatric: Mood and affect are normal. Speech and behavior are normal.  ____________________________________________   LABS (all labs ordered are listed, but only abnormal results are displayed)  Labs Reviewed  POC URINE PREG, ED  POCT PREGNANCY, URINE   RADIOLOGY   Official radiology report(s): Dg Lumbar Spine 2-3 Views  Result Date: 02/06/2018 CLINICAL DATA:  Pain for 1 month EXAM: LUMBAR SPINE - 2-3 VIEW COMPARISON:  None. FINDINGS: There is no evidence of lumbar spine fracture. Alignment is normal. Intervertebral disc spaces are maintained. IMPRESSION: Negative. Electronically Signed   By: Elige KoHetal  Patel   On: 02/06/2018 14:52   ____________________________________________   PROCEDURES  Procedure(s) performed: None  Procedures  Critical Care performed: No  ____________________________________________   INITIAL IMPRESSION / ASSESSMENT AND PLAN / ED COURSE  As part of my medical decision making, I reviewed the following data within the electronic MEDICAL RECORD NUMBER Notes from prior ED visits and [place] Controlled Substance Database  32 year old female presents to the ED with complaint of low back pain with radiation to her left buttocks.  She denies any recent injury to her back but states that she had an injury to her back 5 years ago that was never x-rayed.  Is taken over-the-counter medication without any relief.  Patient has continued to work and ambulate although is been uncomfortable.  Examination is consistent with low back pain with left-sided sciatica.  Patient was made aware that x-rays were were negative for acute bony injury.   She was placed on prednisone and Norco.  She was also given Decadron while in the ED.  Patient was amatory at the time of discharge and was given information about orthopedic referral if she continued to have problems.  ____________________________________________   FINAL CLINICAL IMPRESSION(S) / ED DIAGNOSES  Final diagnoses:  Sciatica of left side     ED Discharge Orders         Ordered    predniSONE (DELTASONE) 10 MG tablet     02/06/18 1510    HYDROcodone-acetaminophen (NORCO/VICODIN) 5-325 MG tablet  Every 6 hours PRN     02/06/18 1511           Note:  This document was prepared using Dragon voice recognition software and may include unintentional dictation errors.    Tommi RumpsSummers, Mikolaj Woolstenhulme L, PA-C 02/06/18 1613    Emily FilbertWilliams, Jonathan E, MD 02/07/18 [phone]

## 2018-02-06 NOTE — ED Triage Notes (Signed)
Patient presents to the ED with "sciatic nerve pain since the 4th".  Patient states pain is in the middle of her "butt cheek".  Patient states she saw a chiropractor on Saturday.  Patient states, "now, I really can't move and I had to call out of work."  Patient walking slowly to triage with a limp.

## 2019-05-31 ENCOUNTER — Emergency Department: Payer: Medicaid Other

## 2019-05-31 ENCOUNTER — Other Ambulatory Visit: Payer: Self-pay

## 2019-05-31 ENCOUNTER — Emergency Department
Admission: EM | Admit: 2019-05-31 | Discharge: 2019-06-01 | Disposition: A | Discharge status: Home / Self Care | Payer: Medicaid Other | Attending: Emergency Medicine | Admitting: Emergency Medicine

## 2019-05-31 DIAGNOSIS — M79651 Pain in right thigh: Secondary | ICD-10-CM | POA: Insufficient documentation

## 2019-05-31 DIAGNOSIS — R262 Difficulty in walking, not elsewhere classified: Secondary | ICD-10-CM | POA: Insufficient documentation

## 2019-05-31 LAB — POCT PREGNANCY, URINE: Preg Test, Ur: NEGATIVE

## 2019-05-31 NOTE — ED Triage Notes (Signed)
Pt complains of right hip pain for two days with pain and "limping" per pt. Pt denies change in cms of foot. Pt is ambulatory with limp.

## 2019-05-31 NOTE — ED Notes (Signed)
PREGNANCY TEST VIA URINE IS NEGATIVE.

## 2019-05-31 NOTE — ED Notes (Signed)
Pt reports right upper thigh pain with limited range of motion.  Denies injury, thinks it may have been from sleeping on her stomach.  Pt was ambulatory to the room.  Pain is currently 6/10.

## 2019-06-01 MED ORDER — PREDNISONE 20 MG PO TABS: 40.0000 mg | ORAL_TABLET | Freq: Every day | ORAL | 0 refills | Status: AC

## 2019-06-01 MED ORDER — LIDOCAINE 5 % EX PTCH: 1.0000 | MEDICATED_PATCH | Freq: Two times a day (BID) | CUTANEOUS | 0 refills | Status: DC

## 2019-06-01 MED ORDER — PREDNISONE 20 MG PO TABS
40.0000 mg | ORAL_TABLET | ORAL | Status: AC
  Administered 2019-06-01: 01:00:00 40 mg via ORAL
  Filled 2019-06-01: qty 2

## 2019-06-01 MED ORDER — LIDOCAINE 5 % EX PTCH
1.0000 | MEDICATED_PATCH | CUTANEOUS | Status: DC
  Administered 2019-06-01: 01:00:00 1 via TRANSDERMAL
  Filled 2019-06-01: qty 1

## 2019-06-01 NOTE — ED Provider Notes (Addendum)
[facility] Emergency Department Provider Note  ____________________________________________   First MD Initiated Contact with Patient 05/31/19 2327     (approximate)  I have reviewed the triage vital signs and the nursing notes.   HISTORY  Chief Complaint Hip Pain    HPI Martha Scott is a 34 y.o. female with no chronic medical issues who presents for evaluation of 2 days of pain in her right upper thigh.  No history of trauma.  She is says that she thinks that she slept on it wrong because she woke up and was having issues.  She said that she can walk and then after a few steps something will click or feel like it pulls and then she has pain in the upper thigh that sometimes radiates down the thigh and around to the back.  No visible swelling or bruising.  She is able to bear weight but sometimes she does so and ambulates with a limp.  No pain in her back.  No numbness nor tingling throughout the right lower extremity.  She says that she has a history of sciatica on the left side of her back but has not had this sort of issue on the right side before.  With the prior issue of sciatica a course of prednisone and a steroid injection completely relieved the discomfort.   She has had no urinary discomfort, dysuria, retention, nor incontinence.          No past medical history on file.  There are no problems to display for this patient.   No past surgical history on file.  Prior to Admission medications   Medication Sig Start Date End Date Taking? Authorizing Provider  ALPRAZolam Prudy Feeler) 0.5 MG tablet Take 0.5 mg by mouth at bedtime as needed for anxiety.    [provider]  HYDROcodone-acetaminophen (NORCO/VICODIN) 5-325 MG tablet Take 1 tablet by mouth every 6 (six) hours as needed for moderate pain. 02/06/18   Tommi Rumps, PA-C  lidocaine (LIDODERM) 5 % Place 1 patch onto the skin every 12 (twelve) hours. Remove & Discard patch within 12  hours or as directed by MD.  Wynelle Fanny the patch off for 12 hours before applying a new one. 06/01/19 05/31/20  Loleta Rose, MD  nitrofurantoin, macrocrystal-monohydrate, (MACROBID) 100 MG capsule Take 1 capsule (100 mg total) by mouth 2 (two) times daily. 02/08/16   Emily Filbert, MD  ondansetron (ZOFRAN ODT) 4 MG disintegrating tablet Take 1 tablet (4 mg total) by mouth every 8 (eight) hours as needed for nausea or vomiting. 02/08/16   Emily Filbert, MD  predniSONE (DELTASONE) 20 MG tablet Take 2 tablets (40 mg total) by mouth daily for 4 days. 06/01/19 06/05/19  Loleta Rose, MD    Allergies Patient has no known allergies.  No family history on file.  Social History Social History   Tobacco Use  . Smoking status: Never Smoker  . Smokeless tobacco: Never Used  Substance Use Topics  . Alcohol use: Yes  . Drug use: No    Review of Systems Constitutional: No fever/chills Cardiovascular: Denies chest pain. Respiratory: Denies shortness of breath. Gastrointestinal: No abdominal pain.  No nausea, no vomiting.   Genitourinary: Negative for dysuria, urinary retention, and urinary incontinence. Musculoskeletal: Pain in right thigh as described above. Integumentary: Negative for bruising. Neurological: Negative for headaches, focal weakness or numbness.   ____________________________________________   PHYSICAL EXAM:  VITAL SIGNS: ED Triage Vitals  Enc Vitals Group  BP 05/31/19 2209 127/85     Pulse Rate 05/31/19 2207 98     Resp 05/31/19 2207 16     Temp 05/31/19 2207 98.4 F (36.9 C)     Temp Source 05/31/19 2207 Oral     SpO2 05/31/19 2207 98 %     Weight 05/31/19 2208 76.2 kg (168 lb)     Height 05/31/19 2208 1.651 m (5\' 5" )     Head Circumference --      Peak Flow --      Pain Score 05/31/19 2207 7     Pain Loc --      Pain Edu? --      Excl. in GC? --     Constitutional: Alert and oriented.  Patient does not appear to be in distress at this time. Eyes:  Conjunctivae are normal.  Head: Atraumatic. Cardiovascular: Normal rate, regular rhythm. Good peripheral circulation. Respiratory: Normal respiratory effort.  No retractions. Musculoskeletal: No gross deformity of her extremities.  She has no bruising or swelling of her right leg and the compartments are soft and easily compressible.  No reproducible tenderness to palpation at this time.  She has normal flexion extension of her knee and she guards her hip when I attempt to abduct the hip because she is concerned it is going to pop or click or cause pain, but is not currently doing so. Neurologic:  Normal speech and language. No gross focal neurologic deficits are appreciated.  Skin:  Skin is warm, dry and intact. Psychiatric: Mood and affect are normal. Speech and behavior are normal.  ____________________________________________   LABS (all labs ordered are listed, but only abnormal results are displayed)  Labs Reviewed  POCT PREGNANCY, URINE  POC URINE PREG, ED   ____________________________________________  EKG  No indication for emergent EKG ____________________________________________  RADIOLOGY I, 2208, personally viewed and evaluated these images (plain radiographs) as part of my medical decision making, as well as reviewing the written report by the radiologist.  ED MD interpretation: No acute abnormalities on x-rays  Official radiology report(s): DG Hip Unilat W or Wo Pelvis 2-3 Views Right  Result Date: 05/31/2019 CLINICAL DATA:  Pain with ambulation. EXAM: DG HIP (WITH OR WITHOUT PELVIS) 2-3V RIGHT COMPARISON:  None. FINDINGS: There is no evidence of hip fracture or dislocation. There is no evidence of arthropathy or other focal bone abnormality. IMPRESSION: Negative. Electronically Signed   By: 07/31/2019 M.D.   On: 05/31/2019 22:59    ____________________________________________   PROCEDURES   Procedure(s) performed (including Critical  Care):  Procedures   ____________________________________________   INITIAL IMPRESSION / MDM / ASSESSMENT AND PLAN / ED COURSE  As part of my medical decision making, I reviewed the following data within the electronic MEDICAL RECORD NUMBER Nursing notes reviewed and incorporated, Radiograph reviewed , Notes from prior ED visits and [place] Controlled Substance Database   No evidence of emergent medical condition, no back pain and no symptoms of cauda equina syndrome.  Compartments are soft and easily compressible and the patient has no traumatic/crush injury history.  She is ambulatory at times.  It sounds like she has a transient musculoskeletal strain that will likely resolve over time.  Because of her success with prednisone in the past with a somewhat similar issue, and lack of comorbidities, I am putting her on a short course of prednisone as well as a Lidoderm patch.  I will give her follow-up information with Dr. 07/31/2019.  I gave my usual  customary return precautions and she understands and agrees with the plan.          ____________________________________________  FINAL CLINICAL IMPRESSION(S) / ED DIAGNOSES  Final diagnoses:  Acute pain of right thigh     MEDICATIONS GIVEN DURING THIS VISIT:  Medications  predniSONE (DELTASONE) tablet 40 mg (has no administration in time range)  lidocaine (LIDODERM) 5 % 1 patch (has no administration in time range)     ED Discharge Orders         Ordered    predniSONE (DELTASONE) 20 MG tablet  Daily     06/01/19 0020    lidocaine (LIDODERM) 5 %  Every 12 hours     06/01/19 0020          *Please note:  Raivyn Kabler was evaluated in Emergency Department on 06/01/2019 for the symptoms described in the history of present illness. She was evaluated in the context of the global COVID-19 pandemic, which necessitated consideration that the patient might be at risk for infection with the SARS-CoV-2 virus that causes COVID-19. Institutional  protocols and algorithms that pertain to the evaluation of patients at risk for COVID-19 are in a state of rapid change based on information released by regulatory bodies including the CDC and federal and state organizations. These policies and algorithms were followed during the patient's care in the ED.  Some ED evaluations and interventions may be delayed as a result of limited staffing during the pandemic.*  Note:  This document was prepared using Dragon voice recognition software and may include unintentional dictation errors.   Hinda Kehr, MD 06/01/19 [place]    Hinda Kehr, MD 06/01/19 [phone]

## 2019-06-01 NOTE — Discharge Instructions (Signed)
While you are taking your prednisone, I recommend AGAINST using ibuprofen because the two medications together can upset your stomach.  Try using the prescriptions provided as well as Tylenol 1000mg  by mouth no more than once every six hours.  If you are still having issue next week when the clinics reopen, call the office of Dr. to set up an appointment, and let them know that you were referred from the Emergency Department.    Return to the emergency department if you develop new or worsening symptoms that concern you.

## 2019-06-22 ENCOUNTER — Encounter: Payer: Self-pay | Admitting: Emergency Medicine

## 2019-06-22 ENCOUNTER — Emergency Department: Payer: Medicaid Other

## 2019-06-22 ENCOUNTER — Other Ambulatory Visit: Payer: Self-pay

## 2019-06-22 ENCOUNTER — Emergency Department
Admission: EM | Admit: 2019-06-22 | Discharge: 2019-06-22 | Disposition: A | Discharge status: Home / Self Care | Payer: Medicaid Other | Attending: Emergency Medicine | Admitting: Emergency Medicine

## 2019-06-22 DIAGNOSIS — U071 COVID-19: Secondary | ICD-10-CM | POA: Diagnosis not present

## 2019-06-22 DIAGNOSIS — R519 Headache, unspecified: Secondary | ICD-10-CM | POA: Diagnosis not present

## 2019-06-22 DIAGNOSIS — J1282 Pneumonia due to coronavirus disease 2019: Secondary | ICD-10-CM | POA: Insufficient documentation

## 2019-06-22 DIAGNOSIS — R509 Fever, unspecified: Secondary | ICD-10-CM | POA: Diagnosis present

## 2019-06-22 LAB — URINALYSIS, COMPLETE (UACMP) WITH MICROSCOPIC
Bilirubin Urine: NEGATIVE
Glucose, UA: NEGATIVE mg/dL
Hgb urine dipstick: NEGATIVE
Ketones, ur: 40 mg/dL — AB
Nitrite: NEGATIVE
Protein, ur: 100 mg/dL — AB
Specific Gravity, Urine: >1.03 — ABNORMAL HIGH (ref 1.005–1.030)
Squamous Epithelial / HPF: >50 (ref 0–5)
WBC, UA: >50 WBC/hpf (ref 0–5)
pH: 6 (ref 5.0–8.0)

## 2019-06-22 LAB — COMPREHENSIVE METABOLIC PANEL
ALT: 19 U/L (ref 0–44)
AST: 24 U/L (ref 15–41)
Albumin: 3.5 g/dL (ref 3.5–5.0)
Alkaline Phosphatase: 52 U/L (ref 38–126)
Anion gap: 11 (ref 5–15)
BUN: 14 mg/dL (ref 6–20)
CO2: 25 mmol/L (ref 22–32)
Calcium: 8 mg/dL — ABNORMAL LOW (ref 8.9–10.3)
Chloride: 97 mmol/L — ABNORMAL LOW (ref 98–111)
Creatinine, Ser: 0.7 mg/dL (ref 0.44–1.00)
GFR calc Af Amer: >60 mL/min (ref >60)
GFR calc non Af Amer: >60 mL/min (ref >60)
Glucose, Bld: 100 mg/dL — ABNORMAL HIGH (ref 70–99)
Potassium: 3.3 mmol/L — ABNORMAL LOW (ref 3.5–5.1)
Sodium: 133 mmol/L — ABNORMAL LOW (ref 135–145)
Total Bilirubin: 0.6 mg/dL (ref 0.3–1.2)
Total Protein: 7.1 g/dL (ref 6.5–8.1)

## 2019-06-22 LAB — CBC WITH DIFFERENTIAL/PLATELET
Abs Immature Granulocytes: 0.02 10*3/uL (ref 0.00–0.07)
Basophils Absolute: 0 10*3/uL (ref 0.0–0.1)
Basophils Relative: 0 %
Eosinophils Absolute: 0 10*3/uL (ref 0.0–0.5)
Eosinophils Relative: 0 %
HCT: 39.8 % (ref 36.0–46.0)
Hemoglobin: 13.3 g/dL (ref 12.0–15.0)
Immature Granulocytes: 0 %
Lymphocytes Relative: 29 %
Lymphs Abs: 1.7 10*3/uL (ref 0.7–4.0)
MCH: 29.2 pg (ref 26.0–34.0)
MCHC: 33.4 g/dL (ref 30.0–36.0)
MCV: 87.5 fL (ref 80.0–100.0)
Monocytes Absolute: 0.7 10*3/uL (ref 0.1–1.0)
Monocytes Relative: 12 %
Neutro Abs: 3.4 10*3/uL (ref 1.7–7.7)
Neutrophils Relative %: 59 %
Platelets: 116 10*3/uL — ABNORMAL LOW (ref 150–400)
RBC: 4.55 MIL/uL (ref 3.87–5.11)
RDW: 13.4 % (ref 11.5–15.5)
WBC: 5.8 10*3/uL (ref 4.0–10.5)
nRBC: 0 % (ref 0.0–0.2)

## 2019-06-22 MED ORDER — SODIUM CHLORIDE 0.9 % IV BOLUS
500.0000 mL | Freq: Once | INTRAVENOUS | Status: AC
  Administered 2019-06-22: 500 mL via INTRAVENOUS

## 2019-06-22 MED ORDER — AZITHROMYCIN 500 MG PO TABS
500.0000 mg | ORAL_TABLET | Freq: Once | ORAL | Status: AC
  Administered 2019-06-22: 500 mg via ORAL
  Filled 2019-06-22: qty 1

## 2019-06-22 MED ORDER — ONDANSETRON HCL 4 MG/2ML IJ SOLN
4.0000 mg | Freq: Once | INTRAMUSCULAR | Status: AC
Start: 2019-06-22 — End: 2019-06-22
  Administered 2019-06-22: 18:00:00 4 mg via INTRAVENOUS
  Filled 2019-06-22: qty 2

## 2019-06-22 MED ORDER — ONDANSETRON HCL 4 MG PO TABS: 4.0000 mg | ORAL_TABLET | Freq: Every day | ORAL | 0 refills | Status: DC | PRN

## 2019-06-22 MED ORDER — SODIUM CHLORIDE 0.9 % IV SOLN
1.0000 g | Freq: Once | INTRAVENOUS | Status: AC
  Administered 2019-06-22: 1 g via INTRAVENOUS
  Filled 2019-06-22: qty 10

## 2019-06-22 MED ORDER — KETOROLAC TROMETHAMINE 30 MG/ML IJ SOLN
30.0000 mg | Freq: Once | INTRAMUSCULAR | Status: AC
  Administered 2019-06-22: 30 mg via INTRAVENOUS
  Filled 2019-06-22: qty 1

## 2019-06-22 MED ORDER — AZITHROMYCIN 250 MG PO TABS
ORAL_TABLET | ORAL | 0 refills | Status: DC
Start: 2019-06-22 — End: 2020-02-28

## 2019-06-22 MED ORDER — POTASSIUM CHLORIDE CRYS ER 20 MEQ PO TBCR
40.0000 meq | EXTENDED_RELEASE_TABLET | Freq: Once | ORAL | Status: AC
  Administered 2019-06-22: 40 meq via ORAL
  Filled 2019-06-22: qty 2

## 2019-06-22 NOTE — ED Triage Notes (Signed)
Pt to ED by POV c/o worsening COVID symptoms. Pt states that she was diagnosed 2 days ago. Pt reports still having fever, sweats, HA, nausea, and body aches. Pt is in NAD.

## 2019-06-22 NOTE — ED Notes (Signed)
PT to restroom to attempt urine sample

## 2019-06-22 NOTE — ED Notes (Signed)
POC pregnancy negative at this time.

## 2019-06-22 NOTE — ED Provider Notes (Signed)
[facility] Emergency Department Provider Note  ____________________________________________  Time seen: Approximately 7:37 PM  I have reviewed the triage vital signs and the nursing notes.   HISTORY  Chief Complaint Fever and Generalized Body Aches    HPI Martha Scott is a 34 y.o. female that presents to the emergency department for evaluation of COVID-19.  Patient tested positive for COVID-19 2 days ago.  Her symptoms started on Sunday with headache, body aches, loss of taste, nasal congestion, intermittent cough, decreased appetite.  Patient states that every time she will tried to eat, she will feel nauseous and have no appetite.  Patient states that her body aches have gotten worse the last couple of days.  She has an intermittent cough but it is not regular.  No shortness of breath or chest pain.  She just does not feel well.  No vomiting or diarrhea.  History reviewed. No pertinent past medical history.  There are no problems to display for this patient.   History reviewed. No pertinent surgical history.  Prior to Admission medications   Medication Sig Start Date End Date Taking? Authorizing Provider  ALPRAZolam Prudy Feeler) 0.5 MG tablet Take 0.5 mg by mouth at bedtime as needed for anxiety.    [provider]  azithromycin (ZITHROMAX Z-PAK) 250 MG tablet Take 2 tablets (500 mg) on  Day 1,  followed by 1 tablet (250 mg) once daily on Days 2 through 5. 06/22/19   Enid Derry, PA-C  HYDROcodone-acetaminophen (NORCO/VICODIN) 5-325 MG tablet Take 1 tablet by mouth every 6 (six) hours as needed for moderate pain. 02/06/18   Tommi Rumps, PA-C  lidocaine (LIDODERM) 5 % Place 1 patch onto the skin every 12 (twelve) hours. Remove & Discard patch within 12 hours or as directed by MD.  Wynelle Fanny the patch off for 12 hours before applying a new one. 06/01/19 05/31/20  Loleta Rose, MD  nitrofurantoin, macrocrystal-monohydrate, (MACROBID) 100 MG capsule Take  1 capsule (100 mg total) by mouth 2 (two) times daily. 02/08/16   Emily Filbert, MD  ondansetron (ZOFRAN ODT) 4 MG disintegrating tablet Take 1 tablet (4 mg total) by mouth every 8 (eight) hours as needed for nausea or vomiting. 02/08/16   Emily Filbert, MD  ondansetron (ZOFRAN) 4 MG tablet Take 1 tablet (4 mg total) by mouth daily as needed for nausea or vomiting. 06/22/19 06/21/20  Enid Derry, PA-C    Allergies Patient has no known allergies.  No family history on file.  Social History Social History   Tobacco Use  . Smoking status: Never Smoker  . Smokeless tobacco: Never Used  Substance Use Topics  . Alcohol use: Yes  . Drug use: No     Review of Systems  Constitutional: Positive for fever. ENT: No upper respiratory complaints. Cardiovascular: No chest pain. Respiratory: Occasional dry cough. No SOB. Gastrointestinal: No abdominal pain. Positive for nausea. No vomiting.  Musculoskeletal: Positive for body aches. Skin: Negative for rash, abrasions, lacerations, ecchymosis. Neurological: Positive for headache.   ____________________________________________   PHYSICAL EXAM:  VITAL SIGNS: ED Triage Vitals  Enc Vitals Group     BP 06/22/19 1706 113/71     Pulse Rate 06/22/19 1706 (!) 110     Resp 06/22/19 1706 16     Temp 06/22/19 1706 97.7 F (36.5 C)     Temp Source 06/22/19 1706 Oral     SpO2 06/22/19 1706 95 %     Weight --  Height --      Head Circumference --      Peak Flow --      Pain Score 06/22/19 1707 8     Pain Loc --      Pain Edu? --      Excl. in GC? --      Constitutional: Alert and oriented. Well appearing and in no acute distress. Eyes: Conjunctivae are normal. PERRL. EOMI. Head: Atraumatic. ENT:      Ears:      Nose: No congestion/rhinnorhea.      Mouth/Throat: Mucous membranes are moist.  Neck: No stridor.   Cardiovascular: Normal rate, regular rhythm.  Good peripheral circulation. Respiratory: Normal  respiratory effort without tachypnea or retractions. Lungs CTAB. Good air entry to the bases with no decreased or absent breath sounds. Gastrointestinal: Bowel sounds 4 quadrants. Soft and nontender to palpation. No guarding or rigidity. No palpable masses. No distention.  Musculoskeletal: Full range of motion to all extremities. No gross deformities appreciated. Neurologic:  Normal speech and language. No gross focal neurologic deficits are appreciated.  Skin:  Skin is warm, dry and intact. No rash noted. Psychiatric: Mood and affect are normal. Speech and behavior are normal. Patient exhibits appropriate insight and judgement.   ____________________________________________   LABS (all labs ordered are listed, but only abnormal results are displayed)  Labs Reviewed  CBC WITH DIFFERENTIAL/PLATELET - Abnormal; Notable for the following components:      Result Value   Platelets 116 (*)    All other components within normal limits  COMPREHENSIVE METABOLIC PANEL - Abnormal; Notable for the following components:   Sodium 133 (*)    Potassium 3.3 (*)    Chloride 97 (*)    Glucose, Bld 100 (*)    Calcium 8.0 (*)    All other components within normal limits  URINALYSIS, COMPLETE (UACMP) WITH MICROSCOPIC - Abnormal; Notable for the following components:   APPearance CLOUDY (*)    Specific Gravity, Urine >1.030 (*)    Ketones, ur 40 (*)    Protein, ur 100 (*)    Leukocytes,Ua SMALL (*)    Bacteria, UA FEW (*)    All other components within normal limits  URINE CULTURE  POC URINE PREG, ED   ____________________________________________  EKG  NSR ____________________________________________  RADIOLOGY Lexine Baton, personally viewed and evaluated these images (plain radiographs) as part of my medical decision making, as well as reviewing the written report by the radiologist.  DG Chest 1 View  Result Date: 06/22/2019 CLINICAL DATA:  34 year old female with fever, body aches  and positive COVID. EXAM: CHEST  1 VIEW COMPARISON:  None. FINDINGS: The cardiomediastinal silhouette is unremarkable. Patchy in streaky opacities within both LOWER lungs noted suspicious for infection/pneumonia. No consolidation, pleural effusion, mass or pneumothorax. No bony abnormalities are noted. IMPRESSION: Patchy and streaky opacities within both LOWER lungs suspicious for infection/pneumonia. Electronically Signed   By: Harmon Pier M.D.   On: 06/22/2019 17:46    ____________________________________________    PROCEDURES  Procedure(s) performed:    Procedures    Medications  cefTRIAXone (ROCEPHIN) 1 g in sodium chloride 0.9 % 100 mL IVPB (1 g Intravenous New Bag/Given 06/22/19 2032)  ondansetron (ZOFRAN) injection 4 mg (4 mg Intravenous Given 06/22/19 1751)  sodium chloride 0.9 % bolus 500 mL (0 mLs Intravenous Stopped 06/22/19 1830)  ketorolac (TORADOL) 30 MG/ML injection 30 mg (30 mg Intravenous Given 06/22/19 1800)  potassium chloride SA (KLOR-CON) CR tablet 40 mEq (40 mEq  Oral Given 06/22/19 1830)  azithromycin (ZITHROMAX) tablet 500 mg (500 mg Oral Given 06/22/19 2032)     ____________________________________________   INITIAL IMPRESSION / ASSESSMENT AND PLAN / ED COURSE  Pertinent labs & imaging results that were available during my care of the patient were reviewed by me and considered in my medical decision making (see chart for details).  Review of the [place] CSRS was performed in accordance of the [place] prior to dispensing any controlled drugs.   Patient's diagnosis is consistent with Covid 19 pneumonia.  Vital signs and exam are reassuring.  Chest x-ray consistent with patchy and streaky opacities within lower lungs suspicious for infection/pneumonia.  Lab work remarkable for sodium 133, potassium 3.3, chloride 97, glucose 100, calcium 8.0.  Patient is likely hyponatremic, hypokalemic and hypochloremic due to not eating and not feeling well.  Patient was given IV fluids  and potassium was supplemented.  Patient feels better after medications and fluids.  Headache and body aches improved after toradol. Patient was given IV Zofran for nausea.  Patient's O2 remained between 97 and 100% with ambulation.  She was given oral azithromycin to cover for infection.  Patient will be discharged home with prescriptions for azithromycin and Zofran. Patient is to follow up with primary care as directed. Patient is given ED precautions to return to the ED for any worsening or new symptoms.  Martha Scott was evaluated in Emergency Department on 06/22/2019 for the symptoms described in the history of present illness. She was evaluated in the context of the global COVID-19 pandemic, which necessitated consideration that the patient might be at risk for infection with the SARS-CoV-2 virus that causes COVID-19. Institutional protocols and algorithms that pertain to the evaluation of patients at risk for COVID-19 are in a state of rapid change based on information released by regulatory bodies including the CDC and federal and state organizations. These policies and algorithms were followed during the patient's care in the ED.   ____________________________________________  FINAL CLINICAL IMPRESSION(S) / ED DIAGNOSES  Final diagnoses:  Pneumonia due to COVID-19 virus      NEW MEDICATIONS STARTED DURING THIS VISIT:  ED Discharge Orders         Ordered    azithromycin (ZITHROMAX Z-PAK) 250 MG tablet     06/22/19 2022    ondansetron (ZOFRAN) 4 MG tablet  Daily PRN     06/22/19 2023              This chart was dictated using voice recognition software/Dragon. Despite best efforts to proofread, errors can occur which can change the meaning. Any change was purely unintentional.    Laban Emperor, PA-C 06/22/19 2318    Earleen Newport, MD 06/22/19 [phone]

## 2019-06-22 NOTE — ED Notes (Signed)
Ambulated pt per PA request. PT walked with steady gait, keeping oxygen saturation of 96% and above.

## 2019-06-23 LAB — POCT PREGNANCY, URINE: Preg Test, Ur: NEGATIVE

## 2019-06-24 LAB — URINE CULTURE: Culture: >=100000 — AB

## 2020-02-28 ENCOUNTER — Ambulatory Visit
Admission: EM | Admit: 2020-02-28 | Discharge: 2020-02-28 | Disposition: A | Discharge status: Home / Self Care | Payer: Medicaid Other | Attending: Family Medicine | Admitting: Family Medicine

## 2020-02-28 ENCOUNTER — Other Ambulatory Visit: Payer: Self-pay

## 2020-02-28 DIAGNOSIS — U071 COVID-19: Secondary | ICD-10-CM | POA: Diagnosis not present

## 2020-02-28 LAB — RESP PANEL BY RT-PCR (FLU A&B, COVID) ARPGX2
Influenza A by PCR: NEGATIVE
Influenza B by PCR: NEGATIVE
SARS Coronavirus 2 by RT PCR: POSITIVE — AB

## 2020-02-28 MED ORDER — IPRATROPIUM BROMIDE 0.06 % NA SOLN: 2.0000 | Freq: Four times a day (QID) | NASAL | 0 refills | Status: AC | PRN

## 2020-02-28 MED ORDER — NAPROXEN 500 MG PO TABS
500.0000 mg | ORAL_TABLET | Freq: Two times a day (BID) | ORAL | 0 refills | Status: DC | PRN
Start: 2020-02-28 — End: 2020-11-06

## 2020-02-28 NOTE — ED Provider Notes (Signed)
[facility]    CSN: [id number] Arrival date & time: 02/28/20  0846      History   Chief Complaint Chief Complaint  Patient presents with  . Fever   HPI  34 year old female presents with fever, respiratory symptoms.  Symptoms started on Wednesday.  She reports sore throat, fever, chills, congestion, runny nose.  Fever has been as high as 103.  She is taken over-the-counter medication without relief.  Pain 4/10 in severity.  No known relieving factors.  No reported sick contacts.  She is unvaccinated.  No other complaints at this time.  Home Medications    Prior to Admission medications   Medication Sig Start Date End Date Taking? Authorizing Provider  ALPRAZolam Prudy Feeler) 0.5 MG tablet Take 0.5 mg by mouth at bedtime as needed for anxiety.   Yes [provider]  ipratropium (ATROVENT) 0.06 % nasal spray Place 2 sprays into both nostrils 4 (four) times daily as needed for rhinitis. 02/28/20  Yes Elysabeth Aust G, DO  naproxen (NAPROSYN) 500 MG tablet Take 1 tablet (500 mg total) by mouth 2 (two) times daily as needed for moderate pain. 02/28/20  Yes Tommie Sams, DO    Family History Family History  Problem Relation Age of Onset  . Healthy Mother   . Healthy Father     Social History Social History   Tobacco Use  . Smoking status: Never Smoker  . Smokeless tobacco: Never Used  Substance Use Topics  . Alcohol use: Yes  . Drug use: No     Allergies   Patient has no known allergies.   Review of Systems Review of Systems Per HPI  Physical Exam Triage Vital Signs ED Triage Vitals  Enc Vitals Group     BP 02/28/20 1002 129/79     Pulse Rate 02/28/20 1002 70     Resp 02/28/20 1002 18     Temp 02/28/20 1002 98.3 F (36.8 C)     Temp Source 02/28/20 1002 Oral     SpO2 02/28/20 1002 100 %     Weight 02/28/20 1000 175 lb (79.4 kg)     Height 02/28/20 1000 5\' 6"  (1.676 m)     Head Circumference --      Peak Flow --      Pain Score 02/28/20  1000 4     Pain Loc --      Pain Edu? --      Excl. in GC? --    Updated Vital Signs BP 129/79 (BP Location: Left Arm)   Pulse 70   Temp 98.3 F (36.8 C) (Oral)   Resp 18   Ht 5\' 6"  (1.676 m)   Wt 79.4 kg   LMP 02/14/2020   SpO2 100%   BMI 28.25 kg/m   Visual Acuity Right Eye Distance:   Left Eye Distance:   Bilateral Distance:    Right Eye Near:   Left Eye Near:    Bilateral Near:     Physical Exam Constitutional:      General: She is not in acute distress.    Appearance: Normal appearance. She is not ill-appearing.  HENT:     Head: Normocephalic and atraumatic.  Eyes:     General:        Right eye: No discharge.        Left eye: No discharge.     Conjunctiva/sclera: Conjunctivae normal.  Cardiovascular:     Rate and Rhythm: Normal rate and regular rhythm.  Heart sounds: No murmur heard.   Pulmonary:     Effort: Pulmonary effort is normal.     Breath sounds: Normal breath sounds. No wheezing, rhonchi or rales.  Neurological:     Mental Status: She is alert.  Psychiatric:        Mood and Affect: Mood normal.        Behavior: Behavior normal.    UC Treatments / Results  Labs (all labs ordered are listed, but only abnormal results are displayed) Labs Reviewed  RESP PANEL BY RT-PCR (FLU A&B, COVID) ARPGX2 - Abnormal; Notable for the following components:      Result Value   SARS Coronavirus 2 by RT PCR POSITIVE (*)    All other components within normal limits    EKG   Radiology No results found.  Procedures Procedures (including critical care time)  Medications Ordered in UC Medications - No data to display  Initial Impression / Assessment and Plan / UC Course  I have reviewed the triage vital signs and the nursing notes.  Pertinent labs & imaging results that were available during my care of the patient were reviewed by me and considered in my medical decision making (see chart for details).    34 year old female presents with  COVID-19.  Patient is experiencing systemic symptoms as she has had ongoing fever.  Overall well-appearing.  Exam unremarkable.  Treating symptomatically with Atrovent and naproxen.  Final Clinical Impressions(s) / UC Diagnoses   Final diagnoses:  COVID     Discharge Instructions     Tylenol 1000 mg 3 times daily for fever.  Medications as prescribed.  Take care  Dr. Adriana Simas    ED Prescriptions    Medication Sig Dispense Auth. Provider   ipratropium (ATROVENT) 0.06 % nasal spray Place 2 sprays into both nostrils 4 (four) times daily as needed for rhinitis. 15 mL Nautica Hotz G, DO   naproxen (NAPROSYN) 500 MG tablet Take 1 tablet (500 mg total) by mouth 2 (two) times daily as needed for moderate pain. 30 tablet Tommie Sams, DO     PDMP not reviewed this encounter.   Tommie Sams, [place] 02/28/20 1143

## 2020-02-28 NOTE — Discharge Instructions (Addendum)
Tylenol 1000 mg 3 times daily for fever.  Medications as prescribed.  Take care  Dr. Adriana Simas

## 2020-02-28 NOTE — ED Triage Notes (Signed)
Patient states that on Wednesday that she started having a fever, chills and sweats. States that she feels like she is very hot. Has taken mulitple OTC medications without relief.

## 2020-10-17 ENCOUNTER — Other Ambulatory Visit: Payer: Self-pay

## 2020-10-17 ENCOUNTER — Ambulatory Visit
Admission: EM | Admit: 2020-10-17 | Discharge: 2020-10-17 | Disposition: A | Discharge status: Home / Self Care | Payer: Medicaid Other | Attending: Emergency Medicine | Admitting: Emergency Medicine

## 2020-10-17 DIAGNOSIS — W57XXXA Bitten or stung by nonvenomous insect and other nonvenomous arthropods, initial encounter: Secondary | ICD-10-CM

## 2020-10-17 DIAGNOSIS — S80861A Insect bite (nonvenomous), right lower leg, initial encounter: Secondary | ICD-10-CM

## 2020-10-17 DIAGNOSIS — L089 Local infection of the skin and subcutaneous tissue, unspecified: Secondary | ICD-10-CM

## 2020-10-17 MED ORDER — DOXYCYCLINE HYCLATE 100 MG PO CAPS: 100.0000 mg | ORAL_CAPSULE | Freq: Two times a day (BID) | ORAL | 0 refills | Status: AC

## 2020-10-17 NOTE — ED Provider Notes (Signed)
Chief Complaint   Chief Complaint  Patient presents with   Insect Bite     Subjective, HPI  Martha Scott is a very pleasant 35 y.o. female who presents with insect bite to the right thigh which occurred on Wednesday.  Patient states that she was outside and wearing thin pants when she felt something sting her.  Patient states that she removed the stinger from her leg, but was uncertain of the insect.  Patient states that she was advised to report to clinic to rule out cellulitis.  She has been using cortisone cream and baking soda paste which has not seemed to help very much.  Patient reports heat to the area with extreme itching.  No fever or chills.  No vomiting.  History obtained from patient.   Patient's problem list, past medical and social history, medications, and allergies were reviewed by me and updated in Epic.    ROS  See HPI.  Objective   Vitals:   10/17/20 1010  BP: 129/69  Pulse: 79  Resp: 18  Temp: 98.7 F (37.1 C)  SpO2: 97%    Vital signs and nursing note reviewed.  General: Appears well-developed and well-nourished. No acute distress.  HEENT: Normocephalic, atraumatic, hearing grossly intact. EOMI, no drainage. No rhinorrhea. Moist mucous membranes.  Neck: Normal range of motion, neck is supple.  Cardiovascular: Normal rate.  Pulm/Chest: No respiratory distress.   Musculoskeletal: No joint deformity, normal range of motion.  Skin: Erythematous insect bite noted to medial right thigh with streaking.  Area is warm to the touch with mild TTP.  Patient reports itching with palpation.  Data  No results found for any visits on 10/17/20.   Assessment & Plan  1. Nonvenomous insect bite of leg with infection, right, initial encounter - doxycycline (VIBRAMYCIN) 100 MG capsule; Take 1 capsule (100 mg total) by mouth 2 (two) times daily for 5 days.  Dispense: 10 capsule; Refill: 0  35 y.o. female presents with insect bite to the right thigh which occurred on  Wednesday.  Patient states that she was outside and wearing thin pants when she felt something sting her.  Patient states that she removed the stinger from her leg, but was uncertain of the insect.  Patient states that she was advised to report to clinic to rule out cellulitis.  She has been using cortisone cream and baking soda paste which has not seemed to help very much.  Patient reports heat to the area with extreme itching.  No fever or chills.  No vomiting.  Chart review completed.  Given symptoms along with assessment findings, concern for cellulitis/infection to the right thigh at the area of insect sting.  Rx doxycycline to the patient's preferred pharmacy to use twice daily for the next 5 days.  Also advised that she could continue using cortisone cream over the area.  Did advise the use of Benadryl versus Zyrtec and famotidine.  Follow-up with PCP within 1 week for recheck.  Patient verbalized understanding and agreed with plan.  Patient stable upon discharge.  Plan:   Discharge Instructions      Take doxycycline as prescribed. Increase fluid intake. You may take Benadryl 25-50mg  every 4-6 hours as needed OR Zyrtec 10mg  daily for itching/inflammation. You may also take over the counter Famotidine 20mg  twice daily to help with itching/inflammation. You may apply ice wrapped in a towel to affected area 3-5 times daily for 10-15 minute intervals. See your PCP if area does not improve in 5-6  days. See your PCP or return to clinic sooner if area worsens or you develop fever.          Amalia Greenhouse, FNP 10/17/20 1040

## 2020-10-17 NOTE — Discharge Instructions (Addendum)
Take doxycycline as prescribed. Increase fluid intake. You may take Benadryl 25-50mg  every 4-6 hours as needed OR Zyrtec 10mg  daily for itching/inflammation. You may also take over the counter Famotidine 20mg  twice daily to help with itching/inflammation. You may apply ice wrapped in a towel to affected area 3-5 times daily for 10-15 minute intervals. See your PCP if area does not improve in 5-6 days. See your PCP or return to clinic sooner if area worsens or you develop fever.

## 2020-10-17 NOTE — ED Triage Notes (Signed)
Patient presents to Urgent Care with complaints of insect bite possible wasp on weds located on right back of her leg. She states increased irritation. Her provider instructed her to come in for eval of possible cellulitis. Treating with cortisone cream and baking soda paste.   Denies fever.

## 2020-11-06 ENCOUNTER — Other Ambulatory Visit: Payer: Self-pay

## 2020-11-06 ENCOUNTER — Ambulatory Visit
Admission: EM | Admit: 2020-11-06 | Discharge: 2020-11-06 | Disposition: A | Discharge status: Home / Self Care | Payer: Medicaid Other | Attending: Emergency Medicine | Admitting: Emergency Medicine

## 2020-11-06 DIAGNOSIS — R079 Chest pain, unspecified: Secondary | ICD-10-CM

## 2020-11-06 DIAGNOSIS — M62830 Muscle spasm of back: Secondary | ICD-10-CM | POA: Diagnosis not present

## 2020-11-06 MED ORDER — METHOCARBAMOL 500 MG PO TABS: 500.0000 mg | ORAL_TABLET | Freq: Two times a day (BID) | ORAL | 0 refills | Status: DC | PRN

## 2020-11-06 MED ORDER — IBUPROFEN 600 MG PO TABS: 600.0000 mg | ORAL_TABLET | Freq: Four times a day (QID) | ORAL | 0 refills | Status: DC | PRN

## 2020-11-06 NOTE — ED Provider Notes (Signed)
[name]    CSN: [id number] Arrival date & time: 11/06/20  8416      History   Chief Complaint Chief Complaint  Patient presents with   Chest Pain    HPI Martha Scott is a 35 y.o. female.  Patient presents with 3-day history of midsternal chest pain.  She also reports a separate issue of right shoulder and upper back muscle spasm.  The chest pain is not radiating to her shoulder or back.  No falls or injury.  The chest pain is 4/10, nonradiating, dull, constant, worse with movement, improves when not moving.  She denies shortness of breath, nausea, diaphoresis, numbness, weakness, dizziness, headache, or other symptoms.  No treatments attempted at home.  She reports no pertinent medical history.  The history is provided by the patient.   History reviewed. No pertinent past medical history.  There are no problems to display for this patient.   History reviewed. No pertinent surgical history.  OB History   No obstetric history on file.      Home Medications    Prior to Admission medications   Medication Sig Start Date End Date Taking? Authorizing Provider  ibuprofen (ADVIL) 600 MG tablet Take 1 tablet (600 mg total) by mouth every 6 (six) hours as needed. 11/06/20  Yes Mickie Bail, NP  methocarbamol (ROBAXIN) 500 MG tablet Take 1 tablet (500 mg total) by mouth 2 (two) times daily as needed for muscle spasms. 11/06/20  Yes Mickie Bail, NP  ALPRAZolam Prudy Feeler) 0.5 MG tablet Take 0.5 mg by mouth at bedtime as needed for anxiety.    [provider]  ipratropium (ATROVENT) 0.06 % nasal spray Place 2 sprays into both nostrils 4 (four) times daily as needed for rhinitis. 02/28/20   Tommie Sams, DO    Family History Family History  Problem Relation Age of Onset   Healthy Mother    Healthy Father     Social History Social History   Tobacco Use   Smoking status: Never   Smokeless tobacco: Never  Substance Use Topics   Alcohol use: Yes   Drug  use: No     Allergies   Fluconazole   Review of Systems Review of Systems  Constitutional:  Negative for chills and fever.  Eyes:  Negative for visual disturbance.  Respiratory:  Negative for cough and shortness of breath.   Cardiovascular:  Positive for chest pain. Negative for palpitations.  Gastrointestinal:  Negative for abdominal pain, nausea and vomiting.  Musculoskeletal:  Positive for back pain and myalgias. Negative for arthralgias, gait problem and neck pain.  Skin:  Negative for color change, rash and wound.  Neurological:  Negative for dizziness, syncope, weakness, numbness and headaches.  All other systems reviewed and are negative.   Physical Exam Triage Vital Signs ED Triage Vitals  Enc Vitals Group     BP      Pulse      Resp      Temp      Temp src      SpO2      Weight      Height      Head Circumference      Peak Flow      Pain Score      Pain Loc      Pain Edu?      Excl. in GC?    No data found.  Updated Vital Signs BP 116/79   Pulse 79  Temp 98.3 F (36.8 C)   Resp 20   LMP 10/26/2020   SpO2 95%   Visual Acuity Right Eye Distance:   Left Eye Distance:   Bilateral Distance:    Right Eye Near:   Left Eye Near:    Bilateral Near:     Physical Exam Vitals and nursing note reviewed.  Constitutional:      General: She is not in acute distress.    Appearance: Normal appearance. She is well-developed. She is not ill-appearing.  HENT:     Head: Normocephalic and atraumatic.     Mouth/Throat:     Mouth: Mucous membranes are moist.  Eyes:     Conjunctiva/sclera: Conjunctivae normal.  Cardiovascular:     Rate and Rhythm: Normal rate and regular rhythm.     Heart sounds: Normal heart sounds.  Pulmonary:     Effort: Pulmonary effort is normal. No respiratory distress.     Breath sounds: Normal breath sounds.  Abdominal:     Palpations: Abdomen is soft.     Tenderness: There is no abdominal tenderness.  Musculoskeletal:      Cervical back: Neck supple.  Skin:    General: Skin is warm and dry.  Neurological:     General: No focal deficit present.     Mental Status: She is alert and oriented to person, place, and time.     Sensory: No sensory deficit.     Motor: No weakness.     Gait: Gait normal.  Psychiatric:        Mood and Affect: Mood normal.        Behavior: Behavior normal.     UC Treatments / Results  Labs (all labs ordered are listed, but only abnormal results are displayed) Labs Reviewed - No data to display  EKG   Radiology No results found.  Procedures Procedures (including critical care time)  Medications Ordered in UC Medications - No data to display  Initial Impression / Assessment and Plan / UC Course  I have reviewed the triage vital signs and the nursing notes.  Pertinent labs & imaging results that were available during my care of the patient were reviewed by me and considered in my medical decision making (see chart for details).   Chest pain.  Muscle spasm of upper back.  Patient declines transfer to the ED.  EKG shows sinus rhythm, rate 80, no ST elevation, compared to previous from 06/22/2019.  I discussed the limitations of chest pain evaluation in an urgent care setting.  Instructed patient to go to the emergency department for evaluation of her chest pain.  Treating muscle spasm with ibuprofen and Robaxin.  Precautions for drowsiness with Robaxin discussed with patient.  Instructed her to follow-up with her PCP if her symptoms are not improving.   Final Clinical Impressions(s) / UC Diagnoses   Final diagnoses:  Chest pain, unspecified type  Muscle spasm of back     Discharge Instructions      Go to the emergency department for evaluation of your chest pain.    Take ibuprofen as needed for discomfort.   Take the muscle relaxer as needed for muscle spasm; Do not drive, operate machinery, or drink alcohol with this medication as it can cause drowsiness.   Follow  up with your primary care provider if your symptoms are not improving.         ED Prescriptions     Medication Sig Dispense Auth. Provider   ibuprofen (ADVIL) 600 MG  tablet Take 1 tablet (600 mg total) by mouth every 6 (six) hours as needed. 30 tablet Mickie Bail, NP   methocarbamol (ROBAXIN) 500 MG tablet Take 1 tablet (500 mg total) by mouth 2 (two) times daily as needed for muscle spasms. 10 tablet Mickie Bail, NP      PDMP not reviewed this encounter.   Mickie Bail, NP 11/06/20 [phone]

## 2020-11-06 NOTE — ED Triage Notes (Signed)
Pt presents with chest pain in middle of chest , also has c/o right shoulder discomfort

## 2020-11-06 NOTE — Discharge Instructions (Addendum)
Go to the emergency department for evaluation of your chest pain.    Take ibuprofen as needed for discomfort.   Take the muscle relaxer as needed for muscle spasm; Do not drive, operate machinery, or drink alcohol with this medication as it can cause drowsiness.   Follow up with your primary care provider if your symptoms are not improving.

## 2020-12-10 ENCOUNTER — Other Ambulatory Visit: Payer: Self-pay

## 2020-12-10 ENCOUNTER — Ambulatory Visit
Admission: EM | Admit: 2020-12-10 | Discharge: 2020-12-10 | Disposition: A | Discharge status: Home / Self Care | Payer: Medicaid Other | Attending: Emergency Medicine | Admitting: Emergency Medicine

## 2020-12-10 DIAGNOSIS — R110 Nausea: Secondary | ICD-10-CM

## 2020-12-10 DIAGNOSIS — Z3202 Encounter for pregnancy test, result negative: Secondary | ICD-10-CM

## 2020-12-10 LAB — POCT URINE PREGNANCY: Preg Test, Ur: NEGATIVE

## 2020-12-10 NOTE — ED Provider Notes (Signed)
[name]    CSN: [id number] Arrival date & time: 12/10/20  1601      History   Chief Complaint Chief Complaint  Patient presents with   Nausea    10 days     HPI Martha Scott is a 35 y.o. female.  Patient presents with nausea.  LMP 10/30/2020 and she is concerned for the possibility of pregnancy.  She has had 4 negative at home pregnancy tests.  She reports her breasts are tender and she has abdominal bloating.  She denies fever, chills, abdominal pain, vomiting, diarrhea, constipation, dysuria, hematuria, vaginal discharge, pelvic pain, or other symptoms.  The history is provided by the patient and medical records.   History reviewed. No pertinent past medical history.  There are no problems to display for this patient.   History reviewed. No pertinent surgical history.  OB History   No obstetric history on file.      Home Medications    Prior to Admission medications   Medication Sig Start Date End Date Taking? Authorizing Provider  ALPRAZolam Prudy Feeler) 0.5 MG tablet Take 0.5 mg by mouth at bedtime as needed for anxiety.    [provider]  ibuprofen (ADVIL) 600 MG tablet Take 1 tablet (600 mg total) by mouth every 6 (six) hours as needed. 11/06/20   Mickie Bail, NP  ipratropium (ATROVENT) 0.06 % nasal spray Place 2 sprays into both nostrils 4 (four) times daily as needed for rhinitis. 02/28/20   Tommie Sams, DO  methocarbamol (ROBAXIN) 500 MG tablet Take 1 tablet (500 mg total) by mouth 2 (two) times daily as needed for muscle spasms. 11/06/20   Mickie Bail, NP    Family History Family History  Problem Relation Age of Onset   Healthy Mother    Healthy Father     Social History Social History   Tobacco Use   Smoking status: Never   Smokeless tobacco: Never  Substance Use Topics   Alcohol use: Yes   Drug use: No     Allergies   Fluconazole   Review of Systems Review of Systems  Constitutional:  Negative for chills and  fever.  Respiratory:  Negative for cough and shortness of breath.   Cardiovascular:  Negative for chest pain and palpitations.  Gastrointestinal:  Positive for nausea. Negative for abdominal pain, constipation, diarrhea and vomiting.  Genitourinary:  Negative for dysuria and hematuria.  All other systems reviewed and are negative.   Physical Exam Triage Vital Signs ED Triage Vitals  Enc Vitals Group     BP      Pulse      Resp      Temp      Temp src      SpO2      Weight      Height      Head Circumference      Peak Flow      Pain Score      Pain Loc      Pain Edu?      Excl. in GC?    No data found.  Updated Vital Signs BP 127/89 (BP Location: Left Arm)   Pulse 90   Temp 98 F (36.7 C) (Oral)   Resp 18   LMP 11/02/2020   SpO2 96%   Visual Acuity Right Eye Distance:   Left Eye Distance:   Bilateral Distance:    Right Eye Near:   Left Eye Near:    Bilateral Near:  Physical Exam Vitals and nursing note reviewed.  Constitutional:      General: She is not in acute distress.    Appearance: Normal appearance. She is well-developed. She is not ill-appearing.  HENT:     Head: Normocephalic and atraumatic.     Mouth/Throat:     Mouth: Mucous membranes are moist.  Eyes:     Conjunctiva/sclera: Conjunctivae normal.  Cardiovascular:     Rate and Rhythm: Normal rate and regular rhythm.     Heart sounds: Normal heart sounds.  Pulmonary:     Effort: Pulmonary effort is normal. No respiratory distress.     Breath sounds: Normal breath sounds.  Abdominal:     General: Bowel sounds are normal. There is no distension.     Palpations: Abdomen is soft.     Tenderness: There is no abdominal tenderness. There is no guarding or rebound.  Musculoskeletal:     Cervical back: Neck supple.  Skin:    General: Skin is warm and dry.  Neurological:     General: No focal deficit present.     Mental Status: She is alert and oriented to person, place, and time.     Gait:  Gait normal.  Psychiatric:        Mood and Affect: Mood normal.        Behavior: Behavior normal.     UC Treatments / Results  Labs (all labs ordered are listed, but only abnormal results are displayed) Labs Reviewed  POCT URINE PREGNANCY    EKG   Radiology No results found.  Procedures Procedures (including critical care time)  Medications Ordered in UC Medications - No data to display  Initial Impression / Assessment and Plan / UC Course  I have reviewed the triage vital signs and the nursing notes.  Pertinent labs & imaging results that were available during my care of the patient were reviewed by me and considered in my medical decision making (see chart for details).  Nausea.  Patient is well-appearing and her exam is reassuring.  She has had 4 at home negative pregnancy tests.  Urine pregnancy negative here also.  Instructed her to follow-up with her PCP or gynecologist.  She agrees to plan of care.   Final Clinical Impressions(s) / UC Diagnoses   Final diagnoses:  Nausea without vomiting     Discharge Instructions      Follow up with your primary care provider if your symptoms are not improving.         ED Prescriptions   None    PDMP not reviewed this encounter.   Mickie Bail, NP 12/10/20 [phone]

## 2020-12-10 NOTE — Discharge Instructions (Addendum)
Follow up with your primary care provider if your symptoms are not improving.     

## 2020-12-10 NOTE — ED Triage Notes (Signed)
Patient presents to Urgent Care with complaints of nausea, breast tenderness, abdominal bloating x 10 days. Pt states she had 4 negative preg tests. LMP 09/02. Concerned with possible preg.

## 2021-02-28 ENCOUNTER — Emergency Department
Admission: EM | Admit: 2021-02-28 | Discharge: 2021-02-28 | Disposition: A | Discharge status: Home / Self Care | Payer: Medicaid Other | Attending: Emergency Medicine | Admitting: Emergency Medicine

## 2021-02-28 ENCOUNTER — Encounter: Payer: Self-pay | Admitting: Emergency Medicine

## 2021-02-28 ENCOUNTER — Other Ambulatory Visit: Payer: Self-pay

## 2021-02-28 DIAGNOSIS — R509 Fever, unspecified: Secondary | ICD-10-CM | POA: Diagnosis present

## 2021-02-28 DIAGNOSIS — U071 COVID-19: Secondary | ICD-10-CM | POA: Diagnosis not present

## 2021-02-28 DIAGNOSIS — Z8616 Personal history of COVID-19: Secondary | ICD-10-CM | POA: Diagnosis not present

## 2021-02-28 LAB — RESP PANEL BY RT-PCR (FLU A&B, COVID) ARPGX2
Influenza A by PCR: NEGATIVE
Influenza B by PCR: NEGATIVE
SARS Coronavirus 2 by RT PCR: POSITIVE — AB

## 2021-02-28 MED ORDER — IBUPROFEN 600 MG PO TABS: 600.0000 mg | ORAL_TABLET | Freq: Four times a day (QID) | ORAL | 0 refills | Status: AC | PRN

## 2021-02-28 MED ORDER — IBUPROFEN 600 MG PO TABS
600.0000 mg | ORAL_TABLET | Freq: Once | ORAL | Status: AC
  Administered 2021-02-28: 600 mg via ORAL
  Filled 2021-02-28: qty 1

## 2021-02-28 MED ORDER — PSEUDOEPH-BROMPHEN-DM 30-2-10 MG/5ML PO SYRP: 5.0000 mL | ORAL_SOLUTION | Freq: Four times a day (QID) | ORAL | 0 refills | Status: DC | PRN

## 2021-02-28 NOTE — ED Triage Notes (Signed)
Pt reports sinus pressure and pain for 2 days. Pt stats her face hurts to touch.

## 2021-02-28 NOTE — ED Notes (Signed)
Helped walk to the BR. Pain still rating a 10.

## 2021-02-28 NOTE — ED Notes (Signed)
States has had a head cold and has taken motrin with no relief. States it hurts to touch. Having green nasal drainage. Using saline spay.

## 2021-02-28 NOTE — ED Provider Notes (Signed)
[facility] Provider Note    Event Date/Time   First MD Initiated Contact with Patient 02/28/21 0900     (approximate)   History   Nasal Congestion and Facial Pain   HPI  Martha Scott is a 36 y.o. female   presents to the ED with complaint of nasal congestion, cough and fever.  She complains of left facial pain and body aches.  Patient states that her son had the flu last week.  Currently has had symptoms for the last 6 days.  She had a non-smoker and otherwise healthy..  She rates her pain as 10/10.      Physical Exam   Triage Vital Signs: ED Triage Vitals  Enc Vitals Group     BP 02/28/21 0819 (!) 131/92     Pulse Rate 02/28/21 0819 (!) 102     Resp 02/28/21 0819 20     Temp 02/28/21 0819 98.8 F (37.1 C)     Temp Source 02/28/21 0819 Oral     SpO2 02/28/21 0819 96 %     Weight 02/28/21 0808 176 lb 5.9 oz (80 kg)     Height 02/28/21 0808 5\' 6"  (1.676 m)     Head Circumference --      Peak Flow --      Pain Score 02/28/21 0808 10     Pain Loc --      Pain Edu? --      Excl. in GC? --     Most recent vital signs: Vitals:   02/28/21 0819  BP: (!) 131/92  Pulse: (!) 102  Resp: 20  Temp: 98.8 F (37.1 C)  SpO2: 96%     General: Awake, no distress.  Patient covered in multiple blankets. CV:  Good peripheral perfusion.  Heart regular rate and rhythm without murmur. Resp:  Normal effort.  Lungs are clear bilaterally.  No wheezes are noted. Abd:  No distention.  Soft, nontender.  Bowel sounds normoactive x4 quadrants. Other:  No rhinorrhea at present, posterior pharynx without erythema or exudate.  Neck is supple without adenopathy.  No rashes noted.   ED Results / Procedures / Treatments   Labs (all labs ordered are listed, but only abnormal results are displayed) Labs Reviewed  RESP PANEL BY RT-PCR (FLU A&B, COVID) ARPGX2 - Abnormal; Notable for the following components:      Result Value   SARS Coronavirus 2 by RT PCR  POSITIVE (*)    All other components within normal limits     PROCEDURES:  Critical Care performed: No  Procedures   MEDICATIONS ORDERED IN ED: Medications  ibuprofen (ADVIL) tablet 600 mg (600 mg Oral Given 02/28/21 1046)     IMPRESSION / MDM / ASSESSMENT AND PLAN / ED COURSE  I reviewed the triage vital signs and the nursing notes.                              Differential diagnosis includes, but is not limited to, viral upper respiratory infection, COVID, influenza and sinusitis.  36 year old female presents to the ED with complaint of nasal congestion, cough and fever.  Patient states the left side of her face aches and she has generalized body aches.  She reports that her son had "the flu last week".  Patient currently has been experiencing the symptoms for the last 6 days.  Patient is a non-smoker and considers herself healthy.  Patient is  not in any acute distress on physical exam but is uncomfortable.  She was made aware that her respiratory panel came back positive for COVID.  We discussed fluids to stay hydrated, ibuprofen or Tylenol as needed for body aches, headache and fever.  A prescription for Bromfed-DM was sent to the pharmacy as well as ibuprofen 600 mg every 6 hours as needed.  She is to follow-up with her PCP or urgent care if any continued problems.  She was made aware that if there is any significant changes such as difficulty breathing or shortness of breath she is to return to the emergency department.        FINAL CLINICAL IMPRESSION(S) / ED DIAGNOSES   Final diagnoses:  COVID     Rx / DC Orders   ED Discharge Orders          Ordered    brompheniramine-pseudoephedrine-DM 30-2-10 MG/5ML syrup  4 times daily PRN        02/28/21 1105    ibuprofen (ADVIL) 600 MG tablet  Every 6 hours PRN        02/28/21 1105             Note:  This document was prepared using Dragon voice recognition software and may include unintentional dictation errors.    Tommi Rumps, PA-C 02/28/21 1159    Shaune Pollack, MD 02/28/21 [phone]

## 2021-02-28 NOTE — Discharge Instructions (Addendum)
Follow-up with your primary care provider if any continued problems or concerns.  If you develop any worsening of your symptoms such as shortness of breath or difficulty breathing return to the emergency department.  A prescription for ibuprofen 600 mg 3 times daily with food and Bromfed-DM was sent to your pharmacy.  Drink lots of fluids to stay hydrated.  You should also quarantine for a total of 10 days since the onset of your symptoms since you have not been vaccinated.  Also contact people that you have been around since you have been sick to let them know that you are positive for COVID.

## 2021-05-03 IMAGING — CR DG HIP (WITH OR WITHOUT PELVIS) 2-3V*R*
3 series · 3 of 3 positions shown · non-contrast
Comparison: None.

CLINICAL DATA: Pain with ambulation.

EXAM:
DG HIP (WITH OR WITHOUT PELVIS) 2-3V RIGHT

[pelvis ap]
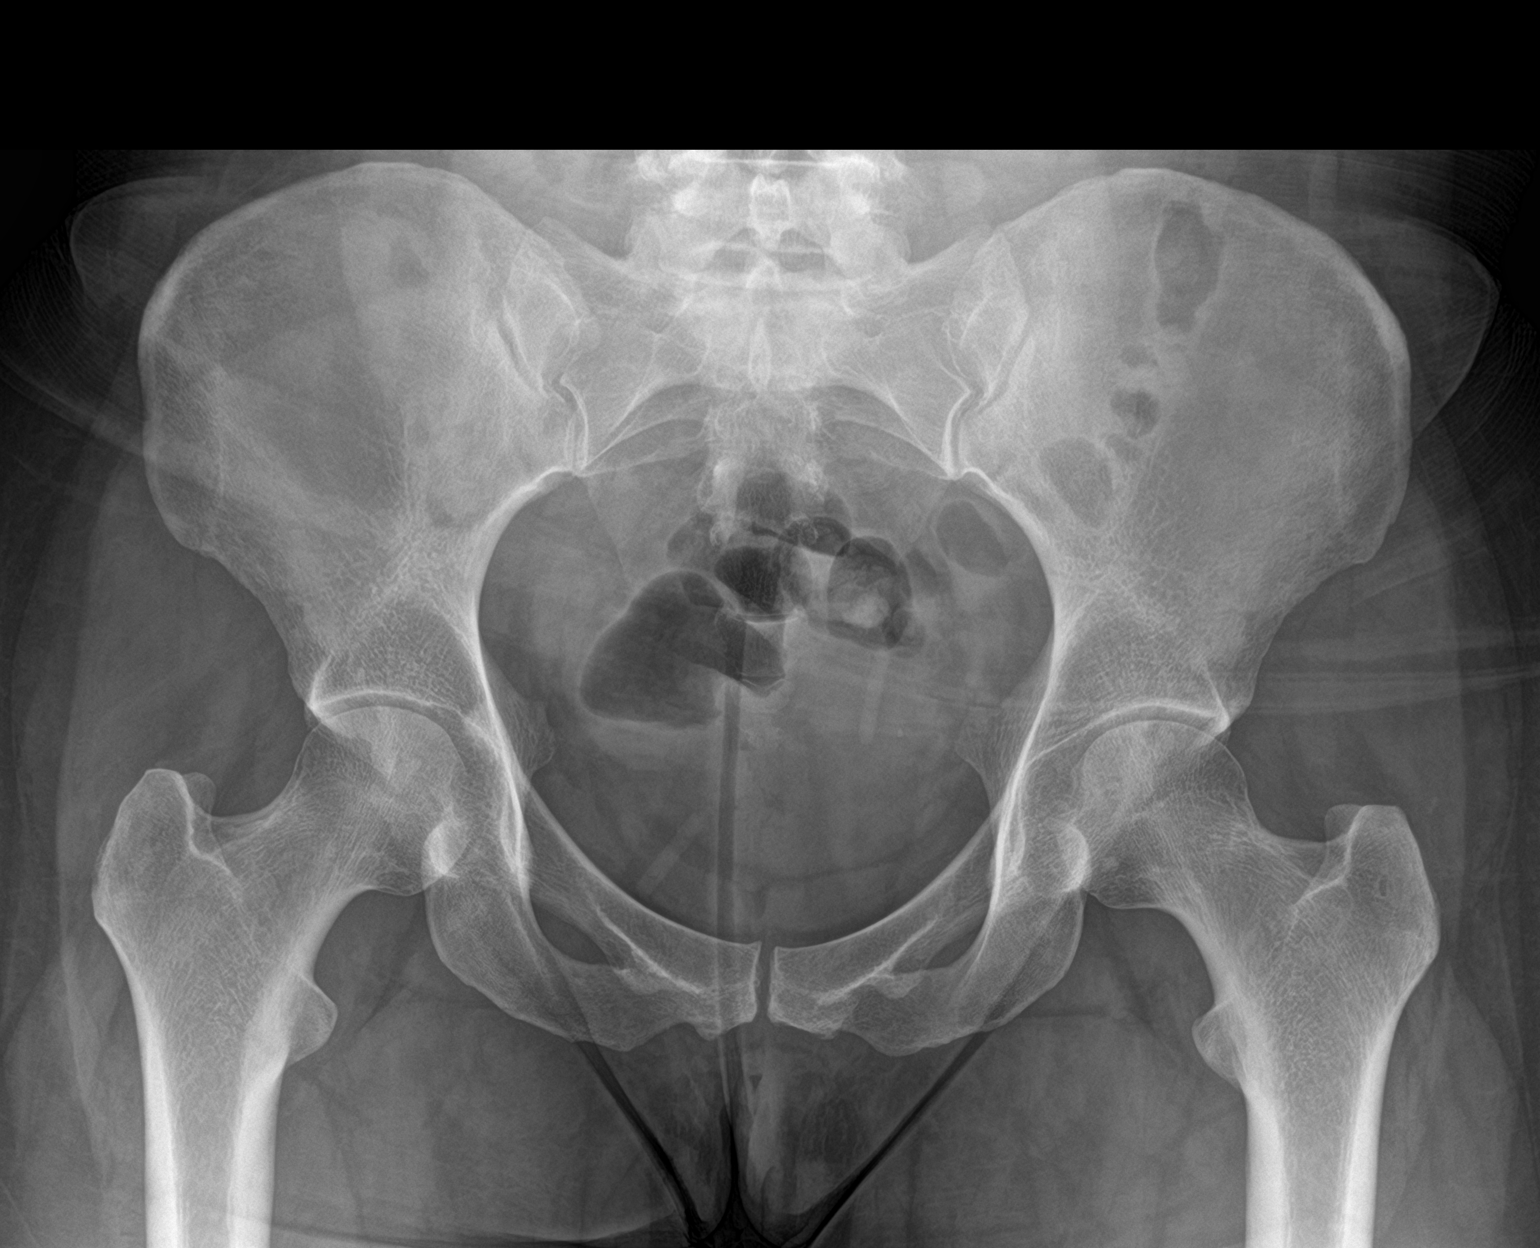

[hip ap]
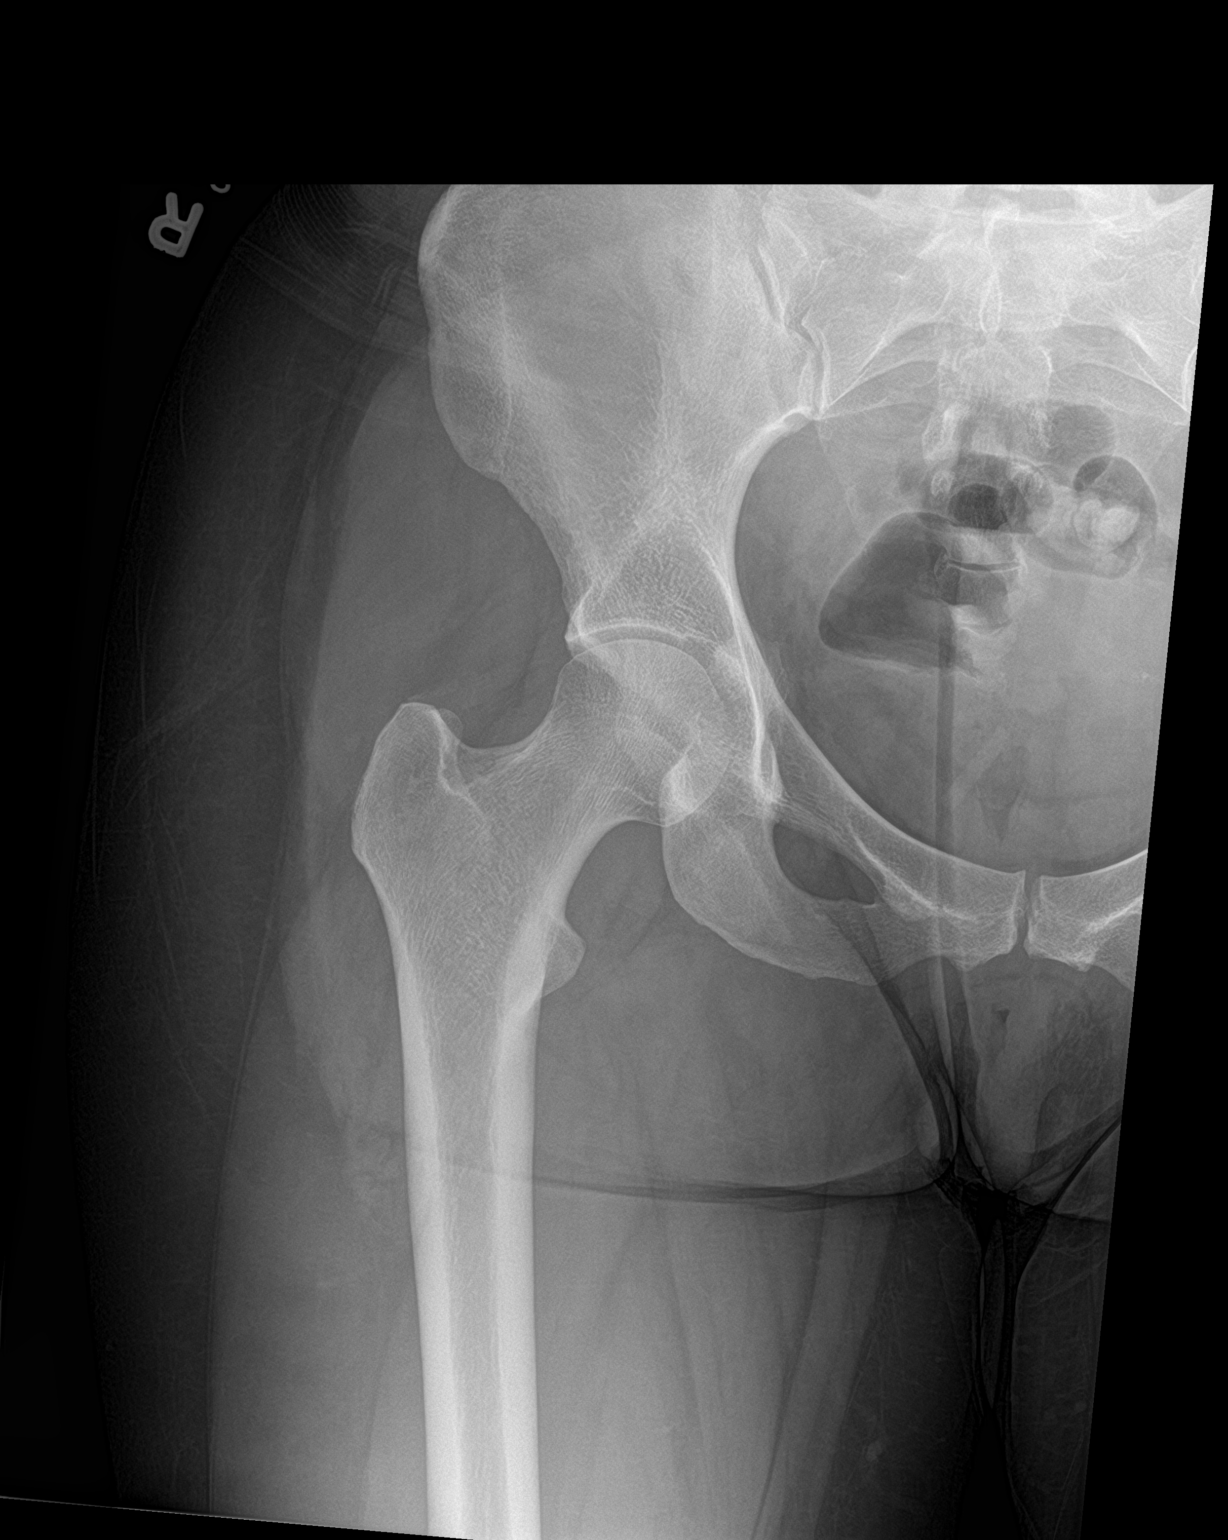

[hip lat]
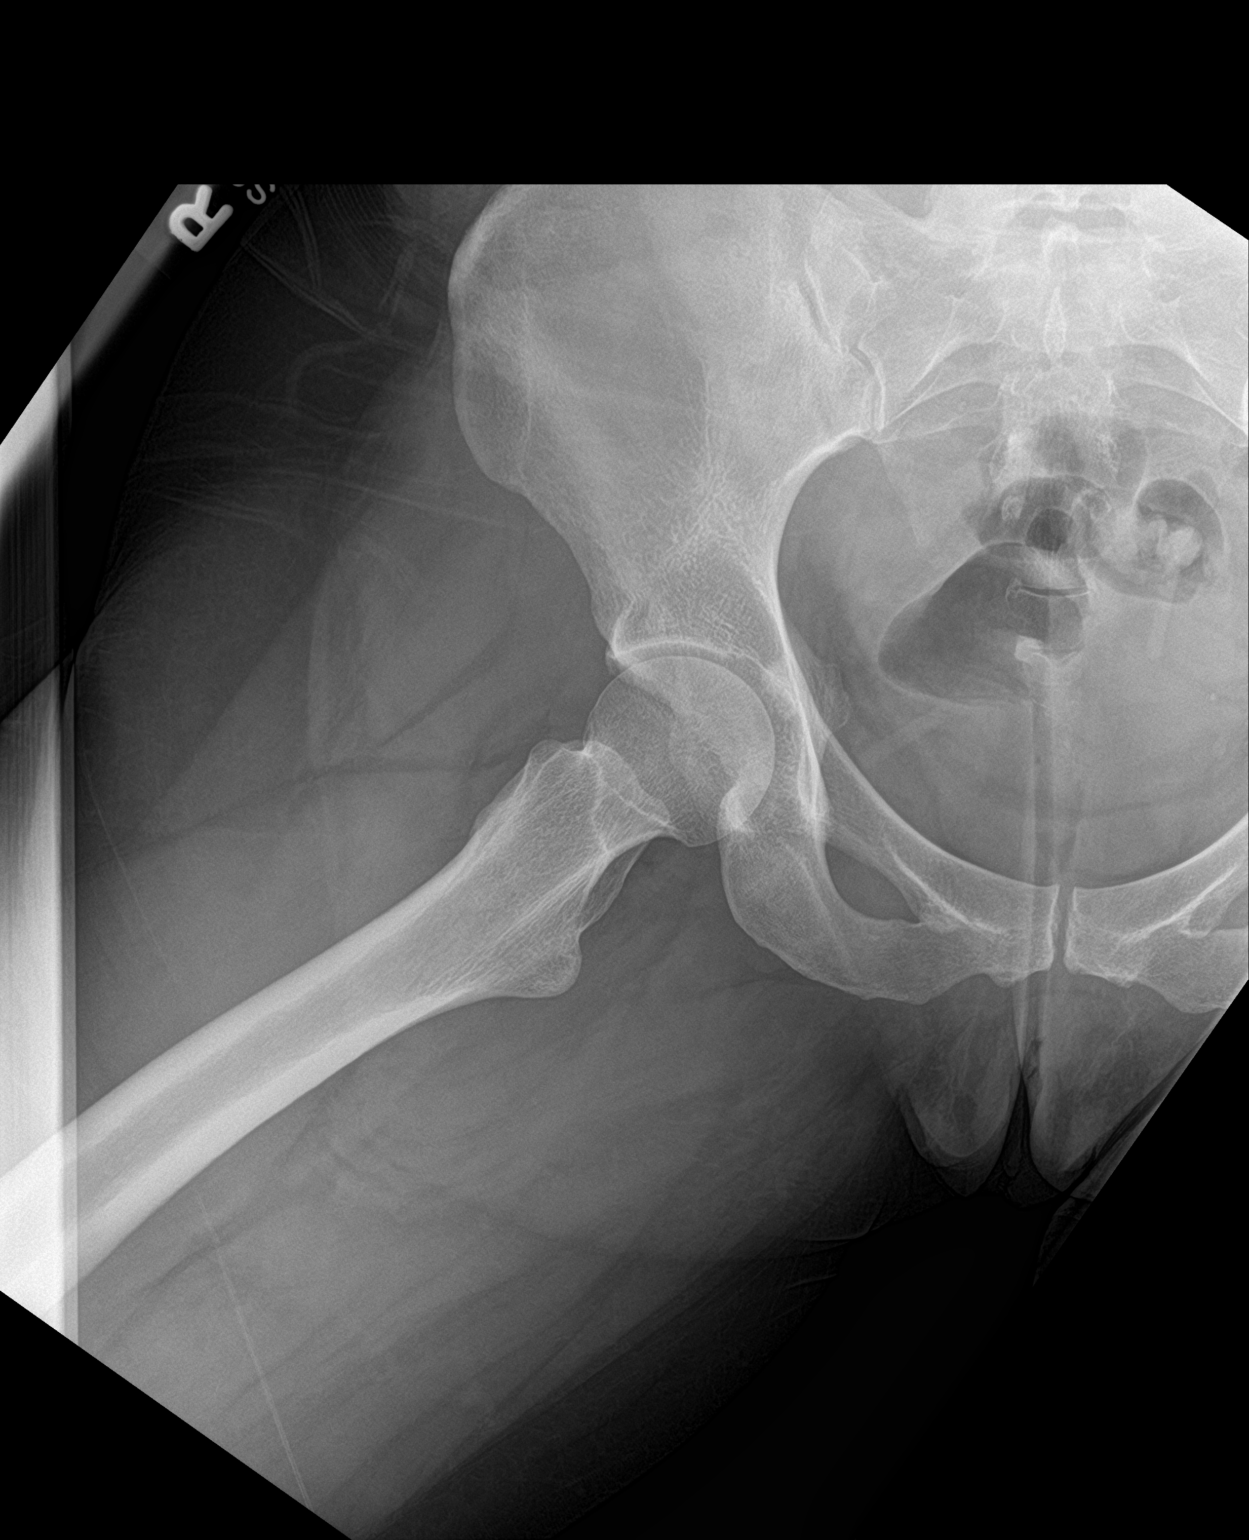

[3 of 3 positions shown; findings below may reference images not displayed]

FINDINGS: There is no evidence of hip fracture or dislocation. There is no
evidence of arthropathy or other focal bone abnormality.
IMPRESSION: Negative.

## 2021-05-25 IMAGING — DX DG CHEST 1V
1 series · 1 of 1 positions shown · non-contrast
Comparison: None.

CLINICAL DATA: 33-year-old female with fever, body aches and
positive COVID.

EXAM:
CHEST  1 VIEW

[chest ap]
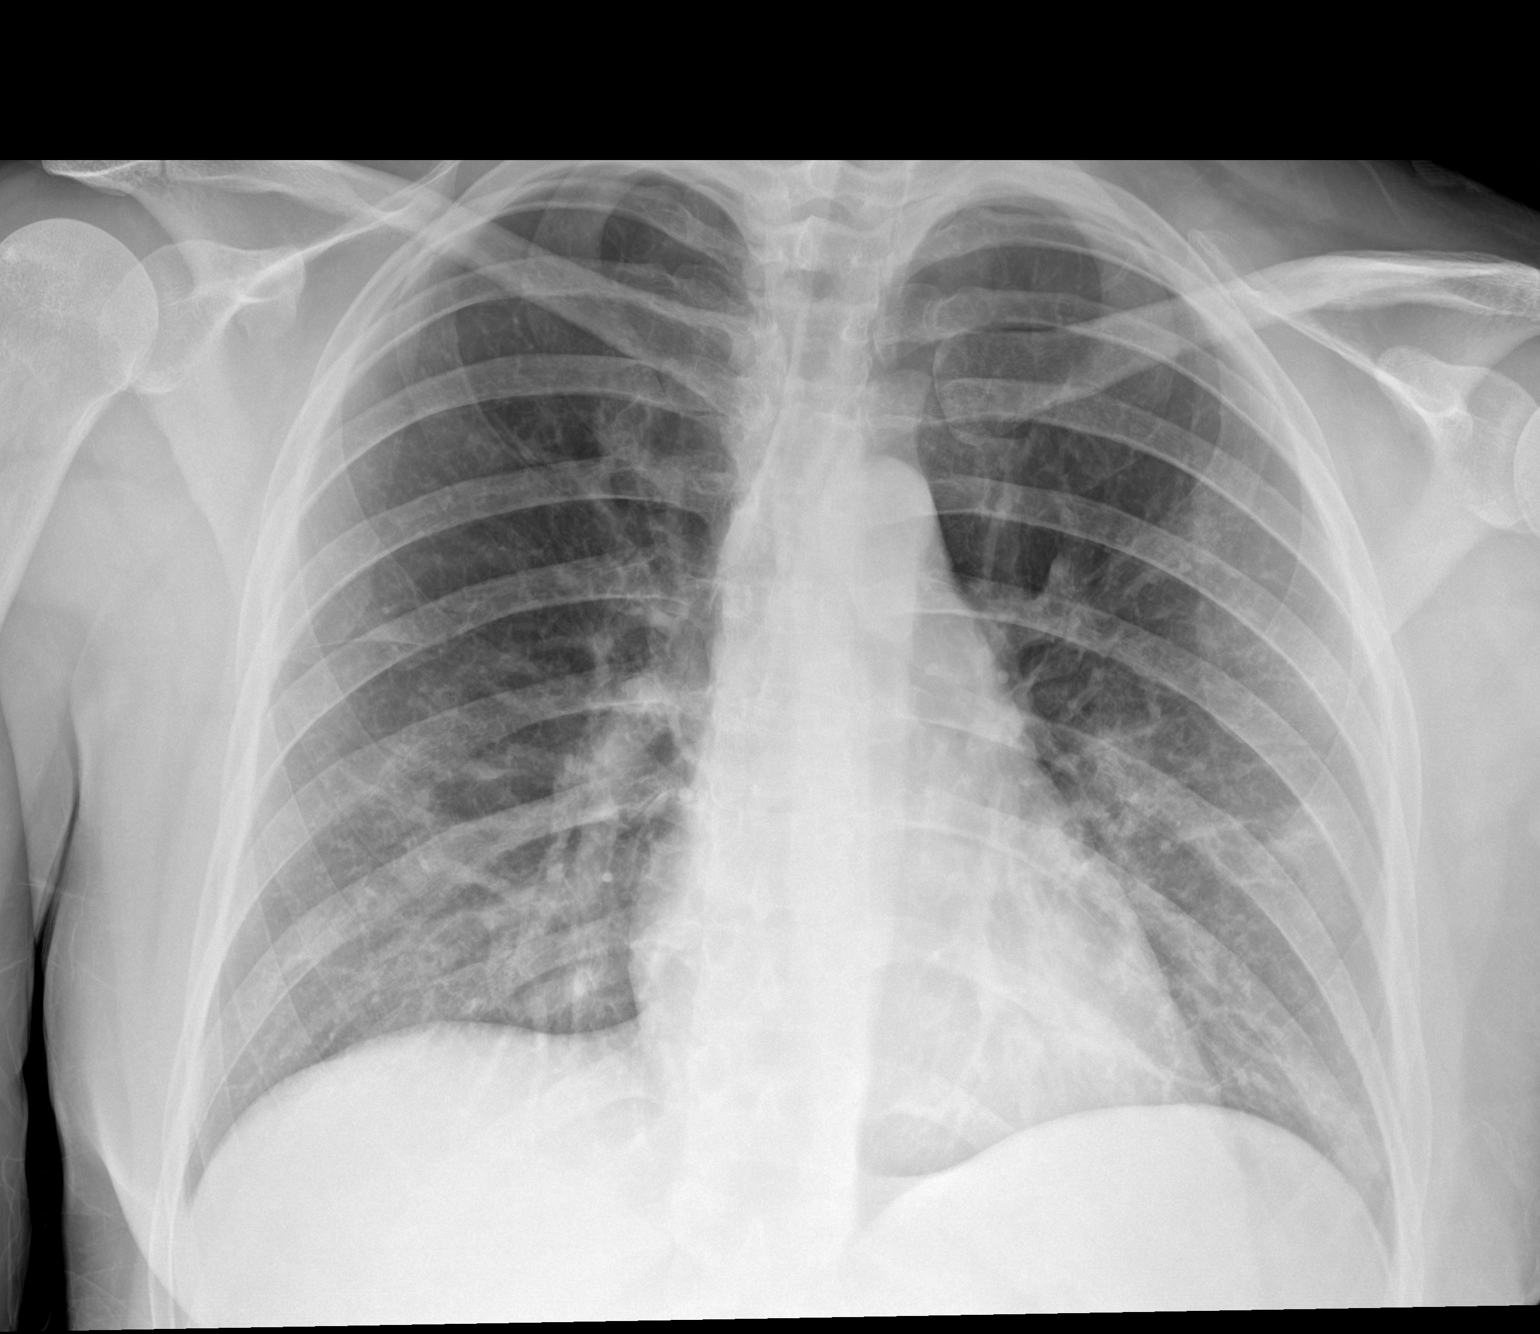

[1 of 1 positions shown; findings below may reference images not displayed]

FINDINGS: The cardiomediastinal silhouette is unremarkable.

Patchy in streaky opacities within both LOWER lungs noted suspicious
for infection/pneumonia.

No consolidation, pleural effusion, mass or pneumothorax.

No bony abnormalities are noted.
IMPRESSION: Patchy and streaky opacities within both LOWER lungs suspicious for
infection/pneumonia.

## 2021-06-01 ENCOUNTER — Ambulatory Visit: Admission: EM | Admit: 2021-06-01 | Discharge: 2021-06-01 | Payer: Medicaid Other

## 2021-06-01 ENCOUNTER — Other Ambulatory Visit: Payer: Self-pay

## 2021-06-01 DIAGNOSIS — O161 Unspecified maternal hypertension, first trimester: Secondary | ICD-10-CM | POA: Diagnosis not present

## 2021-06-01 DIAGNOSIS — H6983 Other specified disorders of Eustachian tube, bilateral: Secondary | ICD-10-CM | POA: Diagnosis not present

## 2021-06-01 NOTE — ED Notes (Addendum)
Patient is being discharged from the Urgent Care and sent to the Emergency Department via POV . Per Alycia Rossetti, NP, patient is in need of higher level of care due to elevated BP . Patient is aware and verbalizes understanding of plan of care.  ?Vitals:  ? 06/01/21 1925 06/01/21 1941  ?BP: (!) 137/120 (!) 164/112  ?Pulse: 63 76  ?Resp: 18   ?Temp: 97.6 ?F (36.4 ?C)   ?SpO2: 100% 100%  ?  ?

## 2021-06-01 NOTE — ED Provider Notes (Signed)
?[facility] ? ? ? ?CSN: [id number] ?Arrival date & time: 06/01/21  1847 ? ? ?  ? ?History   ?Chief Complaint ?Chief Complaint  ?Patient presents with  ? Ear Problem  ? ? ?HPI ?Martha Scott is a 36 y.o. female.  ? ?HPI ? ?36 year old female here for evaluation of ear complaints. ? ?Patient reports that she has been experiencing a feeling of both of her ears being clogged but also has been experiencing pain in her right ear for the last 1 to 2 weeks.  She states that she has decreased hearing in both ears.  She reports having a history of a ruptured eardrum on the right and believes that water gets trapped in her ear.  She has had multiple recurrent middle ear infections.  She denies any fever, runny nose, or nasal congestion.  No cough or shortness of breath.  At that incidental finding patient's blood pressure was 137/120.  She denies headache, chest pain, change in vision, or shortness of breath.  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  She does not smoke.  Patient reports that she is pregnant and may very well be towards the end of her first trimester.  At the end of February her beta hCG was 115.  Following that she had a couple of days of spotting and bleeding and she has not been able to go in to see her OB to have repeat blood work drawn. ? ?History reviewed. No pertinent past medical history. ? ?There are no problems to display for this patient. ? ? ?History reviewed. No pertinent surgical history. ? ?OB History   ?No obstetric history on file. ?  ? ? ? ?Home Medications   ? ?Prior to Admission medications   ?Medication Sig Start Date End Date Taking? Authorizing Provider  ?ALPRAZolam (XANAX) 0.5 MG tablet Take 0.5 mg by mouth at bedtime as needed for anxiety.   Yes [provider]  ?ibuprofen (ADVIL) 600 MG tablet Take 1 tablet (600 mg total) by mouth every 6 (six) hours as needed. 02/28/21  Yes Bridget Hartshorn L, PA-C  ?ipratropium (ATROVENT) 0.06 % nasal spray Place 2  sprays into both nostrils 4 (four) times daily as needed for rhinitis. 02/28/20  Yes Tommie Sams, DO  ? ? ?Family History ?Family History  ?Problem Relation Age of Onset  ? Healthy Mother   ? Healthy Father   ? ? ?Social History ?Social History  ? ?Tobacco Use  ? Smoking status: Never  ? Smokeless tobacco: Never  ?Vaping Use  ? Vaping Use: Never used  ?Substance Use Topics  ? Alcohol use: Yes  ? Drug use: No  ? ? ? ?Allergies   ?Fluconazole ? ? ?Review of Systems ?Review of Systems  ?Constitutional:  Negative for fever.  ?HENT:  Positive for ear pain and hearing loss. Negative for congestion and ear discharge.   ?Respiratory:  Negative for cough and shortness of breath.   ?Cardiovascular:  Negative for chest pain.  ?Neurological:  Negative for dizziness, facial asymmetry, weakness, numbness and headaches.  ?Hematological: Negative.   ?Psychiatric/Behavioral: Negative.    ? ? ?Physical Exam ?Triage Vital Signs ?ED Triage Vitals  ?Enc Vitals Group  ?   BP --   ?   Pulse --   ?   Resp --   ?   Temp --   ?   Temp src --   ?   SpO2 --   ?  Weight 06/01/21 1922 189 lb (85.7 kg)  ?   Height 06/01/21 1922 5\' 5"  (1.651 m)  ?   Head Circumference --   ?   Peak Flow --   ?   Pain Score 06/01/21 1921 6  ?   Pain Loc --   ?   Pain Edu? --   ?   Excl. in GC? --   ? ?No data found. ? ?Updated Vital Signs ?BP (!) 164/112 (BP Location: Left Arm)   Pulse 76   Temp 97.6 ?F (36.4 ?C) (Oral)   Resp 18   Ht 5\' 5"  (1.651 m)   Wt 189 lb (85.7 kg)   LMP 05/11/2021   SpO2 100%   BMI 31.45 kg/m?  ? ?Visual Acuity ?Right Eye Distance:   ?Left Eye Distance:   ?Bilateral Distance:   ? ?Right Eye Near:   ?Left Eye Near:    ?Bilateral Near:    ? ?Physical Exam ?Vitals and nursing note reviewed.  ?Constitutional:   ?   Appearance: Normal appearance. She is not ill-appearing.  ?HENT:  ?   Head: Normocephalic and atraumatic.  ?   Right Ear: Tympanic membrane, ear canal and external ear normal. There is no impacted cerumen.  ?   Left Ear:  Tympanic membrane, ear canal and external ear normal. There is no impacted cerumen.  ?Cardiovascular:  ?   Rate and Rhythm: Normal rate and regular rhythm.  ?   Pulses: Normal pulses.  ?   Heart sounds: Normal heart sounds. No murmur heard. ?  No friction rub. No gallop.  ?Pulmonary:  ?   Effort: Pulmonary effort is normal.  ?   Breath sounds: Normal breath sounds. No wheezing, rhonchi or rales.  ?Skin: ?   General: Skin is warm and dry.  ?   Capillary Refill: Capillary refill takes less than 2 seconds.  ?   Findings: No erythema or rash.  ?Neurological:  ?   General: No focal deficit present.  ?   Mental Status: She is alert and oriented to person, place, and time.  ?Psychiatric:     ?   Mood and Affect: Mood normal.     ?   Behavior: Behavior normal.     ?   Thought Content: Thought content normal.     ?   Judgment: Judgment normal.  ? ? ? ?UC Treatments / Results  ?Labs ?(all labs ordered are listed, but only abnormal results are displayed) ?Labs Reviewed - No data to display ? ?EKG ? ? ?Radiology ?No results found. ? ?Procedures ?Procedures (including critical care time) ? ?Medications Ordered in UC ?Medications - No data to display ? ?Initial Impression / Assessment and Plan / UC Course  ?I have reviewed the triage vital signs and the nursing notes. ? ?Pertinent labs & imaging results that were available during my care of the patient were reviewed by me and considered in my medical decision making (see chart for details). ? ?Patient is a nontoxic-appearing 36 year old female here for evaluation of bilateral ear complaints as outlined HPI above.  Physical exam reveals pearly-gray tympanic membranes bilaterally with normal light reflex and clear external auditory canals.  There is a very mild effusion behind the left tympanic membrane.  Patient does have tenderness with palpation of the eustachian tube externally on the right-hand side but not the left-hand side.  I coached the patient through attempting to  equalize her ear since she states that neither one of them will pop.  I believe patient has eustachian tube dysfunction which is leading to her decreased hearing and pain in her right ear.  Cardiopulmonary exam reveals S1-S2 heart sounds and clear lung sounds in all fields.  I discussed the patient's blood pressure with her and she advised that she needs currently pregnant and is wondering if of pregnancy could have affected her blood pressure.  She also states that several weeks ago she had an episode of spotting and passed a few clots but she and her OB are not sure if she had a miscarriage or not.  She was post to go in for repeat blood work but she has not been able to get into see her physician's office to have repeat hormone levels checked.  Given the possibility of pregnancy this leaves a concern for possible preeclampsia and I have therefore recommended that the patient go to the emergency department at [facility] for further evaluation. ? ? ?Final Clinical Impressions(s) / UC Diagnoses  ? ?Final diagnoses:  ?Hypertension during pregnancy in first trimester, unspecified hypertension in pregnancy type  ?Eustachian tube dysfunction, bilateral  ? ? ? ?Discharge Instructions   ? ?  ?Please go to the emergency department at [facility] for evaluation of your elevated blood pressure in the setting of possible pregnancy. ? ? ? ? ?ED Prescriptions   ?None ?  ? ?PDMP not reviewed this encounter. ?  ?Becky Augusta, NP ?06/01/21 1947 ? ?

## 2021-06-01 NOTE — Discharge Instructions (Addendum)
Please go to the emergency department at [facility] for evaluation of your elevated blood pressure in the setting of possible pregnancy. ?

## 2021-06-01 NOTE — ED Triage Notes (Signed)
Pt c/o possible right ear infection x1-2weeks. Pt states that both ears feel "clogged" ? ?Pt has a ruptured ear drum in her right ear and believes she has water in her ear.  ?

## 2021-09-22 ENCOUNTER — Ambulatory Visit
Admission: EM | Admit: 2021-09-22 | Discharge: 2021-09-22 | Disposition: A | Discharge status: Home / Self Care | Payer: Medicaid Other | Attending: Family Medicine | Admitting: Family Medicine

## 2021-09-22 DIAGNOSIS — R0789 Other chest pain: Secondary | ICD-10-CM | POA: Diagnosis not present

## 2021-09-22 DIAGNOSIS — R030 Elevated blood-pressure reading, without diagnosis of hypertension: Secondary | ICD-10-CM | POA: Diagnosis not present

## 2021-09-22 DIAGNOSIS — K30 Functional dyspepsia: Secondary | ICD-10-CM

## 2021-09-22 MED ORDER — ALUM & MAG HYDROXIDE-SIMETH 200-200-20 MG/5ML PO SUSP
30.0000 mL | Freq: Once | ORAL | Status: AC
  Administered 2021-09-22: 30 mL via ORAL

## 2021-09-22 MED ORDER — LIDOCAINE VISCOUS HCL 2 % MT SOLN
15.0000 mL | Freq: Once | OROMUCOSAL | Status: AC
  Administered 2021-09-22: 15 mL via OROMUCOSAL

## 2021-09-22 NOTE — ED Triage Notes (Signed)
Pt presents with complaints of burning sensation in her chest that started today. She is seeing her pcp for elevated bp on Friday. Pt has history of same with relief with gi cocktail.

## 2021-09-22 NOTE — ED Provider Notes (Signed)
Martha Scott    CSN: [id number] Arrival date & time: 09/22/21  1825      History   Chief Complaint Chief Complaint  Patient presents with   Chest Pain    HPI Martha Scott is a 35 y.o. female.   HPI Patient with a history of elevated blood pressure (no current antihypertensive therapy), anxiety, and GERD presents today with acute onset chest pain which she characterizes as chest pressure and burning sensation. She has had a similar episode in the past which she was treated for as acute acid indigestion during an ER visit several months agio. On arrival, BP mildly elevated, although remains below 150/90. She reports her PCP has been assisting with monitoring of her BP and wanted to hold off on starting medication and advised to target lifestyle to controlled BP. She is not experiencing any weakness, headache, or syncopal episodes. Patient alert oriented, and stable on arrival.   History reviewed. No pertinent past medical history.  There are no problems to display for this patient.   History reviewed. No pertinent surgical history.  OB History   No obstetric history on file.      Home Medications    Prior to Admission medications   Medication Sig Start Date End Date Taking? Authorizing Provider  ALPRAZolam Prudy Feeler) 0.5 MG tablet Take 0.5 mg by mouth at bedtime as needed for anxiety.    [provider]  ibuprofen (ADVIL) 600 MG tablet Take 1 tablet (600 mg total) by mouth every 6 (six) hours as needed. 02/28/21   Bridget Hartshorn L, PA-C  ipratropium (ATROVENT) 0.06 % nasal spray Place 2 sprays into both nostrils 4 (four) times daily as needed for rhinitis. 02/28/20   Tommie Sams, DO    Family History Family History  Problem Relation Age of Onset   Healthy Mother    Healthy Father     Social History Social History   Tobacco Use   Smoking status: Never   Smokeless tobacco: Never  Vaping Use   Vaping Use: Never used  Substance Use Topics    Alcohol use: Yes   Drug use: No     Allergies   Fluconazole   Review of Systems Review of Systems Pertinent negatives listed in HPI   Physical Exam Triage Vital Signs ED Triage Vitals  Enc Vitals Group     BP      Pulse      Resp      Temp      Temp src      SpO2      Weight      Height      Head Circumference      Peak Flow      Pain Score      Pain Loc      Pain Edu?      Excl. in GC?    No data found.  Updated Vital Signs BP 132/78   Pulse 86   Temp 98 F (36.7 C)   Resp 18   SpO2 98%   Visual Acuity Right Eye Distance:   Left Eye Distance:   Bilateral Distance:    Right Eye Near:   Left Eye Near:    Bilateral Near:     Physical Exam Vitals and nursing note reviewed.  Constitutional:      Appearance: She is well-developed.  HENT:     Head: Normocephalic and atraumatic.  Eyes:     Extraocular Movements: Extraocular movements intact.  Pupils: Pupils are equal, round, and reactive to light.  Cardiovascular:     Rate and Rhythm: Normal rate and regular rhythm.     Heart sounds: Normal heart sounds. Heart sounds not distant. No murmur heard. Pulmonary:     Effort: Pulmonary effort is normal.     Breath sounds: No decreased breath sounds, wheezing or rhonchi.  Chest:     Chest wall: No tenderness.  Abdominal:     Palpations: Abdomen is soft. There is no mass.     Tenderness: There is no abdominal tenderness. There is no rebound.  Musculoskeletal:     Cervical back: Normal range of motion and neck supple.  Lymphadenopathy:     Cervical: No cervical adenopathy.  Skin:    General: Skin is warm and dry.     Capillary Refill: Capillary refill takes less than 2 seconds.  Neurological:     General: No focal deficit present.     Mental Status: She is alert and oriented to person, place, and time.  Psychiatric:        Mood and Affect: Mood normal.        Behavior: Behavior normal.    UC Treatments / Results  Labs (all labs ordered are  listed, but only abnormal results are displayed) Labs Reviewed - No data to display  EKG NSR 78, no ST changes or ischemics changes (Personally interpreted by Joaquin Courts, FNP-C)    Radiology No results found.  Procedures Procedures (including critical care time)  Medications Ordered in UC Medications  alum & mag hydroxide-simeth (MAALOX/MYLANTA) 200-200-20 MG/5ML suspension 30 mL (30 mLs Oral Given 09/22/21 1900)  lidocaine (XYLOCAINE) 2 % viscous mouth solution 15 mL (15 mLs Mouth/Throat Given 09/22/21 1900)    Initial Impression / Assessment and Plan / UC Course  I have reviewed the triage vital signs and the nursing notes.  Pertinent labs & imaging results that were available during my care of the patient were reviewed by me and considered in my medical decision making (see chart for details).    Patient presents with atypical chest pain characterized by chest burning and pressure. Patient is a well appearing 36 year old female with a benign cardiac history. ECG unremarkable. Given patients history of acid indigestion and GERD and current presentation, patient treated with a GI cocktail and reexamined and achieved relief of symptoms.BP rechecked prior to discharge, reading improved 132/78. Keep follow-up with PCP. Strict ER precautions given if CP reoccurs. Patient stable for discharge and verbalized understanding and agreement with plan. Final Clinical Impressions(s) / UC Diagnoses   Final diagnoses:  Acid indigestion  Burning chest pain  Elevated BP without diagnosis of hypertension     Discharge Instructions      Recommend taking blood pressure daily in morning after using the restroom to empty your bladder around the same time in order to obtain accurate blood pressure readings.  Your EKG is normal.  Your blood pressure reading today 145/87 initially on recheck BP reading 132/78.  For acid reflux symptoms, recommend over the counter Zantac 360 or Famotine 20 mg  as needed or prior to eating acid foods.     ED Prescriptions   None    PDMP not reviewed this encounter.   Bing Neighbors, FNP 10/01/21 [phone]

## 2021-09-22 NOTE — Discharge Instructions (Addendum)
Recommend taking blood pressure daily in morning after using the restroom to empty your bladder around the same time in order to obtain accurate blood pressure readings.  Your EKG is normal.  Your blood pressure reading today 145/87 initially on recheck BP reading 132/78.  For acid reflux symptoms, recommend over the counter Zantac 360 or Famotine 20 mg as needed or prior to eating acid foods.

## 2022-12-29 ENCOUNTER — Other Ambulatory Visit (HOSPITAL_BASED_OUTPATIENT_CLINIC_OR_DEPARTMENT_OTHER): Payer: Self-pay

## 2022-12-29 MED ORDER — FLULAVAL 0.5 ML IM SUSY
0.5000 mL | PREFILLED_SYRINGE | Freq: Once | INTRAMUSCULAR | 0 refills | Status: AC
  Filled 2022-12-29: qty 0.5, 1d supply, fill #0

## 2023-06-15 ENCOUNTER — Other Ambulatory Visit: Payer: Self-pay | Admitting: Family

## 2023-06-15 DIAGNOSIS — Z1231 Encounter for screening mammogram for malignant neoplasm of breast: Secondary | ICD-10-CM

## 2023-12-26 ENCOUNTER — Other Ambulatory Visit (HOSPITAL_BASED_OUTPATIENT_CLINIC_OR_DEPARTMENT_OTHER): Payer: Self-pay

## 2023-12-27 ENCOUNTER — Other Ambulatory Visit (HOSPITAL_BASED_OUTPATIENT_CLINIC_OR_DEPARTMENT_OTHER): Payer: Self-pay

## 2023-12-27 MED ORDER — FLUZONE 0.5 ML IM SUSY
0.5000 mL | PREFILLED_SYRINGE | Freq: Once | INTRAMUSCULAR | 0 refills | Status: AC
  Filled 2023-12-27: qty 0.5, 1d supply, fill #0
